# Patient Record
Sex: Male | Born: 1990 | Race: Black or African American | Hispanic: No | Marital: Single | Smoking: Never smoker
Health system: Southern US, Community
[De-identification: ages and names within clinical notes are randomized; demographics above are authoritative.]

## PROBLEM LIST (undated history)

## (undated) DIAGNOSIS — IMO0002 Reserved for concepts with insufficient information to code with codable children: Secondary | ICD-10-CM

---
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
## 2012-05-28 ENCOUNTER — Emergency Department (INDEPENDENT_AMBULATORY_CARE_PROVIDER_SITE_OTHER): Payer: Self-pay

## 2012-05-28 ENCOUNTER — Emergency Department (INDEPENDENT_AMBULATORY_CARE_PROVIDER_SITE_OTHER)
Admission: EM | Admit: 2012-05-28 | Discharge: 2012-05-28 | Disposition: A | Discharge status: Home / Self Care | Payer: Self-pay | Source: Home / Self Care | Attending: Emergency Medicine | Admitting: Emergency Medicine

## 2012-05-28 ENCOUNTER — Encounter (HOSPITAL_COMMUNITY): Payer: Self-pay

## 2012-05-28 DIAGNOSIS — IMO0002 Reserved for concepts with insufficient information to code with codable children: Secondary | ICD-10-CM

## 2012-05-28 DIAGNOSIS — S8390XA Sprain of unspecified site of unspecified knee, initial encounter: Secondary | ICD-10-CM

## 2012-05-28 DIAGNOSIS — S6000XA Contusion of unspecified finger without damage to nail, initial encounter: Secondary | ICD-10-CM

## 2012-05-28 DIAGNOSIS — S60041A Contusion of right ring finger without damage to nail, initial encounter: Secondary | ICD-10-CM

## 2012-05-28 MED ORDER — TRAMADOL HCL 50 MG PO TABS: 100.0000 mg | ORAL_TABLET | Freq: Three times a day (TID) | ORAL | Status: AC | PRN
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
## 2013-07-05 ENCOUNTER — Emergency Department (HOSPITAL_COMMUNITY): Payer: Self-pay

## 2013-07-05 ENCOUNTER — Emergency Department (INDEPENDENT_AMBULATORY_CARE_PROVIDER_SITE_OTHER): Payer: Worker's Compensation

## 2013-07-05 ENCOUNTER — Encounter (HOSPITAL_COMMUNITY): Payer: Self-pay

## 2013-07-05 ENCOUNTER — Emergency Department (INDEPENDENT_AMBULATORY_CARE_PROVIDER_SITE_OTHER)
Admission: EM | Admit: 2013-07-05 | Discharge: 2013-07-05 | Disposition: A | Discharge status: Home / Self Care | Payer: Managed Care, Other (non HMO) | Source: Home / Self Care | Attending: Family Medicine | Admitting: Family Medicine

## 2013-07-05 DIAGNOSIS — IMO0001 Reserved for inherently not codable concepts without codable children: Secondary | ICD-10-CM

## 2013-07-05 DIAGNOSIS — M20019 Mallet finger of unspecified finger(s): Secondary | ICD-10-CM

## 2013-07-05 NOTE — ED Notes (Addendum)
States his right ring finger got caught in boxes he was moving at work on Friday; used motrin for pain w/o relief; history of surgery on same finger. Finger deformity DIP, reports pain entire finger; skin intactHistory correction: patient injury on Friday of last week

## 2013-07-05 NOTE — ED Provider Notes (Signed)
  CSN: [id number]     Arrival date & time 07/05/13  1633 History     First MD Initiated Contact with Patient 07/05/13 1813     Chief Complaint  Patient presents with  . Finger Injury   (Consider location/radiation/quality/duration/timing/severity/associated sxs/prior Treatment) Patient is a 22 y.o. male presenting with hand pain. The history is provided by the patient.  Hand Pain This is a new problem. The current episode started more than 2 days ago (injured on fri, unable to work on sat, moving boxes and caught finger.). The problem has not changed since onset.   History reviewed. No pertinent past medical history. Past Surgical History  Procedure Laterality Date  . Finger surgery     History reviewed. No pertinent family history. History  Substance Use Topics  . Smoking status: Never Smoker   . Smokeless tobacco: Not on file  . Alcohol Use: No    Review of Systems  Constitutional: Negative.   Musculoskeletal: Positive for joint swelling.  Skin: Negative.     Allergies  Review of patient's allergies indicates no known allergies.  Home Medications  No current outpatient prescriptions on file. BP 124/70  Pulse 86  Temp(Src) 98.3 F (36.8 C) (Oral)  Resp 18  SpO2 99% Physical Exam  Nursing note and vitals reviewed. Constitutional: He is oriented to person, place, and time. He appears well-developed and well-nourished.  Musculoskeletal: He exhibits tenderness.       Hands: Neurological: He is alert and oriented to person, place, and time.  Skin: Skin is warm and dry.    ED Course   Procedures (including critical care time)  Labs Reviewed - No data to display Dg Finger Ring Right  07/05/2013   *RADIOLOGY REPORT*  Clinical Data: Pain/deformity post trauma.  RIGHT RING FINGER 2+V  Comparison: 05/28/2012  Findings: Examination demonstrates a persistent flexion deformity of the fourth distal interphalangeal joint unchanged.  No acute fracture or dislocation.   IMPRESSION: No acute findings.  Persistent flexion deformity of the fourth distal interphalangeal joint.   Original Report Authenticated By: Elberta Fortis, M.D.   1. 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   Linna Hoff, MD 07/05/13 [phone]

## 2013-07-12 NOTE — ED Notes (Signed)
Call from [facility] W/C manager , inquiring about diagnosis and disposition of pt , Was advised pt was instructed to f/u w W/C provider of company choice, since we cannot refer on W/C patients from this facility for non-emergent patients.Refered to medical record for further assistance

## 2013-08-02 ENCOUNTER — Emergency Department (INDEPENDENT_AMBULATORY_CARE_PROVIDER_SITE_OTHER)
Admission: EM | Admit: 2013-08-02 | Discharge: 2013-08-02 | Disposition: A | Discharge status: Home / Self Care | Payer: Managed Care, Other (non HMO) | Source: Home / Self Care | Attending: Family Medicine | Admitting: Family Medicine

## 2013-08-02 ENCOUNTER — Encounter (HOSPITAL_COMMUNITY): Payer: Self-pay | Admitting: *Deleted

## 2013-08-02 DIAGNOSIS — R1110 Vomiting, unspecified: Secondary | ICD-10-CM

## 2013-08-02 DIAGNOSIS — R197 Diarrhea, unspecified: Secondary | ICD-10-CM

## 2013-08-02 HISTORY — DX: Reserved for concepts with insufficient information to code with codable children: IMO0002

## 2013-08-02 LAB — POCT URINALYSIS DIP (DEVICE)
Leukocytes, UA: NEGATIVE
Protein, ur: NEGATIVE mg/dL
Specific Gravity, Urine: 1.02 (ref 1.005–1.030)
Urobilinogen, UA: 2 mg/dL — ABNORMAL HIGH (ref 0.0–1.0)
pH: 7 (ref 5.0–8.0)

## 2013-08-02 MED ORDER — ONDANSETRON 4 MG PO TBDP
4.0000 mg | ORAL_TABLET | Freq: Once | ORAL | Status: AC
  Administered 2013-08-02: 4 mg via ORAL

## 2013-08-02 MED ORDER — ONDANSETRON 4 MG PO TBDP: 4.0000 mg | ORAL_TABLET | Freq: Three times a day (TID) | ORAL | Status: DC | PRN

## 2013-08-02 MED ORDER — ONDANSETRON 4 MG PO TBDP
ORAL_TABLET | ORAL | Status: AC
  Filled 2013-08-02: qty 1

## 2013-08-02 NOTE — ED Notes (Signed)
C/o stomach pain, nausea and vomiting after eating [employer] chicken @ 1700.  Vomited x 2.  Nausea gone now but stomach still hurts.  Had diarrhea x 4-5 since he has been here.

## 2013-08-02 NOTE — ED Provider Notes (Signed)
Medical screening examination/treatment/procedure(s) were performed by resident physician or non-physician practitioner and as supervising physician I was immediately available for consultation/collaboration.   Selwyn Reason DOUGLAS MD.   Alyse Kathan D Terriah Reggio, MD 08/02/13 2026 

## 2013-08-02 NOTE — ED Provider Notes (Signed)
CSN: [id number]     Arrival date & time 08/02/13  1806 History   First MD Initiated Contact with Patient 08/02/13 1928     Chief Complaint  Patient presents with  . Nausea   (Consider location/radiation/quality/duration/timing/severity/associated sxs/prior Treatment) Patient is a 22 y.o. male presenting with vomiting. The history is provided by the patient. No language interpreter was used.  Emesis Severity:  Moderate Duration:  3 hours Timing:  Intermittent Number of daily episodes:  3 Progression:  Improving Chronicity:  New Relieved by:  Nothing Worsened by:  Nothing tried Ineffective treatments:  None tried Associated symptoms: abdominal pain and diarrhea    Pt complains of abdominal cramping, vomiting and diarrhea.   Pt reports began after eating at [employer].   Past Medical History  Diagnosis Date  . Tendon laceration    Past Surgical History  Procedure Laterality Date  . Finger surgery Right 2006    fx ring finger   Family History  Problem Relation Age of Onset  . Diabetes Mother    History  Substance Use Topics  . Smoking status: Never Smoker   . Smokeless tobacco: Not on file  . Alcohol Use: No    Review of Systems  Gastrointestinal: Positive for vomiting, abdominal pain and diarrhea.  All other systems reviewed and are negative.    Allergies  Review of patient's allergies indicates no known allergies.  Home Medications   Current Outpatient Rx  Name  Route  Sig  Dispense  Refill  . ibuprofen (ADVIL,MOTRIN) 600 MG tablet   Oral   Take 600 mg by mouth every 6 (six) hours as needed for pain.          BP 105/51  Pulse 59  Temp(Src) 98.6 F (37 C) (Oral)  Resp 16  SpO2 97% Physical Exam  Nursing note and vitals reviewed. Constitutional: He is oriented to person, place, and time. He appears well-developed and well-nourished.  HENT:  Head: Normocephalic.  Eyes: EOM are normal. Pupils are equal, round, and reactive to light.  Neck: Normal  range of motion.  Cardiovascular: Normal rate and regular rhythm.   Pulmonary/Chest: Effort normal and breath sounds normal.  Abdominal: Soft. Bowel sounds are normal.  Musculoskeletal: Normal range of motion.  Neurological: He is alert and oriented to person, place, and time.  Psychiatric: He has a normal mood and affect.    ED Course  Procedures (including critical care time) Labs Review Labs Reviewed  URINALYSIS, DIPSTICK ONLY   Imaging Review No results found.  MDM   1. Vomiting   2. Diarrhea    zofran  Odt.    Pt given rx for zofran.   I advised recheck in 24 hours if symptoms not improving    Elson Areas, [place] 08/02/13 2018

## 2013-08-23 ENCOUNTER — Telehealth (HOSPITAL_COMMUNITY): Payer: Self-pay | Admitting: *Deleted

## 2013-08-23 NOTE — ED Notes (Signed)
Pt. called and said he needs a copy of his work note from 8/11 or 8/12.  He said he gave it to them, but they lost it.  I told him I would call back when it was ready. Vassie Moselle 08/23/2013

## 2013-08-28 NOTE — ED Notes (Signed)
Work note

## 2013-11-08 ENCOUNTER — Emergency Department (HOSPITAL_COMMUNITY): Payer: Managed Care, Other (non HMO)

## 2013-11-08 ENCOUNTER — Encounter (HOSPITAL_COMMUNITY): Payer: Self-pay | Admitting: Emergency Medicine

## 2013-11-08 ENCOUNTER — Emergency Department (HOSPITAL_COMMUNITY)
Admission: EM | Admit: 2013-11-08 | Discharge: 2013-11-08 | Disposition: A | Discharge status: Home / Self Care | Payer: Managed Care, Other (non HMO) | Attending: Emergency Medicine | Admitting: Emergency Medicine

## 2013-11-08 DIAGNOSIS — R079 Chest pain, unspecified: Secondary | ICD-10-CM | POA: Insufficient documentation

## 2013-11-08 DIAGNOSIS — R059 Cough, unspecified: Secondary | ICD-10-CM | POA: Insufficient documentation

## 2013-11-08 DIAGNOSIS — R509 Fever, unspecified: Secondary | ICD-10-CM | POA: Insufficient documentation

## 2013-11-08 DIAGNOSIS — R05 Cough: Secondary | ICD-10-CM | POA: Insufficient documentation

## 2013-11-08 DIAGNOSIS — R110 Nausea: Secondary | ICD-10-CM | POA: Insufficient documentation

## 2013-11-08 DIAGNOSIS — B9789 Other viral agents as the cause of diseases classified elsewhere: Secondary | ICD-10-CM | POA: Insufficient documentation

## 2013-11-08 DIAGNOSIS — IMO0001 Reserved for inherently not codable concepts without codable children: Secondary | ICD-10-CM | POA: Insufficient documentation

## 2013-11-08 DIAGNOSIS — B349 Viral infection, unspecified: Secondary | ICD-10-CM

## 2013-11-08 MED ORDER — ONDANSETRON HCL 4 MG PO TABS: 4.0000 mg | ORAL_TABLET | Freq: Four times a day (QID) | ORAL | Status: DC

## 2013-11-08 NOTE — ED Notes (Signed)
Pt c/o abd pain; fever; headache; back pain

## 2013-11-08 NOTE — ED Provider Notes (Signed)
Medical screening examination/treatment/procedure(s) were performed by non-physician practitioner and as supervising physician I was immediately available for consultation/collaboration.  EKG Interpretation   None       Devoria Albe, MD, Armando Gang   Ward Givens, MD 11/08/13 [phone]

## 2013-11-08 NOTE — ED Provider Notes (Signed)
CSN: [id number]     Arrival date & time 11/08/13  0630 History   First MD Initiated Contact with Patient 11/08/13 [phone]     Chief Complaint  Patient presents with  . Abdominal Pain   (Consider location/radiation/quality/duration/timing/severity/associated sxs/prior Treatment) HPI Comments: Patient presents to the ED with a chief complaint of chest pain, cough, generalized myalgias, and subjective fever.  He also endorses nausea, but no vomiting, diarrhea, constipation, or urinary symptoms.  Patient states that the symptoms started last night.  He has not tried anything to alleviate his symptoms.  Nothing makes his symptoms better or worse.  He has no other health problems.  The history is provided by the patient. No language interpreter was used.    Past Medical History  Diagnosis Date  . Tendon laceration    Past Surgical History  Procedure Laterality Date  . Finger surgery Right 2006    fx ring finger   Family History  Problem Relation Age of Onset  . Diabetes Mother    History  Substance Use Topics  . Smoking status: Never Smoker   . Smokeless tobacco: Not on file  . Alcohol Use: No    Review of Systems  All other systems reviewed and are negative.    Allergies  Review of patient's allergies indicates no known allergies.  Home Medications   Current Outpatient Rx  Name  Route  Sig  Dispense  Refill  . ibuprofen (ADVIL,MOTRIN) 600 MG tablet   Oral   Take 600 mg by mouth every 6 (six) hours as needed for pain.         Marland Kitchen ondansetron (ZOFRAN ODT) 4 MG disintegrating tablet   Oral   Take 1 tablet (4 mg total) by mouth every 8 (eight) hours as needed for nausea.   10 tablet   0    There were no vitals taken for this visit. Physical Exam  Nursing note and vitals reviewed. Constitutional: He is oriented to person, place, and time. He appears well-developed and well-nourished.  HENT:  Head: Normocephalic and atraumatic.  Right Ear: External ear normal.  Left  Ear: External ear normal.  Nose: Nose normal.  Mouth/Throat: Oropharynx is clear and moist. No oropharyngeal exudate.  TMs are clear, oropharynx is mildly erythematous, no exudates, no signs of tonsillar or peritonsillar abscess  Eyes: Conjunctivae and EOM are normal. Pupils are equal, round, and reactive to light. Right eye exhibits no discharge. Left eye exhibits no discharge. No scleral icterus.  Neck: Normal range of motion. Neck supple. No JVD present.  Cardiovascular: Normal rate, regular rhythm, normal heart sounds and intact distal pulses.  Exam reveals no gallop and no friction rub.   No murmur heard. Pulmonary/Chest: Effort normal and breath sounds normal. No respiratory distress. He has no wheezes. He has no rales. He exhibits no tenderness.  Abdominal: Soft. He exhibits no distension and no mass. There is no tenderness. There is no rebound and no guarding.  No RLQ tenderness or pain at McBurney's point, no Murphy's sign, no left sided tenderness, no signs  Musculoskeletal: Normal range of motion. He exhibits no edema and no tenderness.  Neurological: He is alert and oriented to person, place, and time.  Skin: Skin is warm and dry.  Psychiatric: He has a normal mood and affect. His behavior is normal. Judgment and thought content normal.    ED Course  Procedures (including critical care time) Labs Review Labs Reviewed - No data to display Imaging Review No results found.  EKG Interpretation   None       MDM   1. Viral syndrome    No focal abdominal tenderness.  Patient looks very well.  VSS and afebrile.  Not in any apparent distress.  Pt CXR negative for acute infiltrate. Patients symptoms are consistent with URI, likely viral etiology. Discussed that antibiotics are not indicated for viral infections. Pt will be discharged with symptomatic treatment.  Verbalizes understanding and is agreeable with plan. Pt is hemodynamically stable & in NAD prior to  dc.     Roxy Horseman, PA-C 11/08/13 [phone]

## 2013-11-12 ENCOUNTER — Emergency Department (HOSPITAL_COMMUNITY)
Admission: EM | Admit: 2013-11-12 | Discharge: 2013-11-12 | Disposition: A | Discharge status: Home / Self Care | Payer: Managed Care, Other (non HMO) | Attending: Emergency Medicine | Admitting: Emergency Medicine

## 2013-11-12 ENCOUNTER — Encounter (HOSPITAL_COMMUNITY): Payer: Self-pay | Admitting: Emergency Medicine

## 2013-11-12 DIAGNOSIS — B9789 Other viral agents as the cause of diseases classified elsewhere: Secondary | ICD-10-CM

## 2013-11-12 DIAGNOSIS — J069 Acute upper respiratory infection, unspecified: Secondary | ICD-10-CM | POA: Insufficient documentation

## 2013-11-12 DIAGNOSIS — R1013 Epigastric pain: Secondary | ICD-10-CM | POA: Insufficient documentation

## 2013-11-12 DIAGNOSIS — R42 Dizziness and giddiness: Secondary | ICD-10-CM | POA: Insufficient documentation

## 2013-11-12 MED ORDER — PANTOPRAZOLE SODIUM 20 MG PO TBEC: 20.0000 mg | DELAYED_RELEASE_TABLET | Freq: Every day | ORAL | Status: DC

## 2013-11-12 MED ORDER — PROMETHAZINE HCL 25 MG PO TABS: 25.0000 mg | ORAL_TABLET | Freq: Four times a day (QID) | ORAL | Status: DC | PRN

## 2013-11-12 MED ORDER — GI COCKTAIL ~~LOC~~
30.0000 mL | Freq: Once | ORAL | Status: AC
  Administered 2013-11-12: 30 mL via ORAL
  Filled 2013-11-12: qty 30

## 2013-11-12 MED ORDER — METOCLOPRAMIDE HCL 5 MG/ML IJ SOLN
10.0000 mg | Freq: Once | INTRAMUSCULAR | Status: AC
  Administered 2013-11-12: 10 mg via INTRAMUSCULAR
  Filled 2013-11-12: qty 2

## 2013-11-12 NOTE — ED Provider Notes (Signed)
Medical screening examination/treatment/procedure(s) were performed by non-physician practitioner and as supervising physician I was immediately available for consultation/collaboration.    Ofilia Rayon M Darthula Desa, MD 11/12/13 2133 

## 2013-11-12 NOTE — ED Notes (Signed)
Patient is alert and oriented x3.  He was given DC instructions and follow up visit instructions.  Patient gave verbal understanding.  He was DC ambulatory under his own power to home.  V/S stable.  He was not showing any signs of distress on DC 

## 2013-11-12 NOTE — ED Provider Notes (Signed)
CSN: [id number]     Arrival date & time 11/12/13  0034 History   First MD Initiated Contact with Patient 11/12/13 0132     Chief Complaint  Patient presents with  . Headache  . Abdominal Pain   (Consider location/radiation/quality/duration/timing/severity/associated sxs/prior Treatment) HPI History provided by pt.   Pt presents w/ constant headache behind the eyes x 3 days.  Had single episode of dizziness upon standing at onset, but otherwise associated w/ subjective fever photophobia, nasal congestion, rhinorrhea, cough, stomach ache, generalized weakness.  Denies vision changes and N/V/D.  Known sick contacts.  No head trauma.  No PMH.  Past Medical History  Diagnosis Date  . Tendon laceration    Past Surgical History  Procedure Laterality Date  . Finger surgery Right 2006    fx ring finger   Family History  Problem Relation Age of Onset  . Diabetes Mother    History  Substance Use Topics  . Smoking status: Never Smoker   . Smokeless tobacco: Not on file  . Alcohol Use: No    Review of Systems  All other systems reviewed and are negative.    Allergies  Review of patient's allergies indicates no known allergies.  Home Medications   Current Outpatient Rx  Name  Route  Sig  Dispense  Refill  . acetaminophen (TYLENOL) 500 MG tablet   Oral   Take 500 mg by mouth every 6 (six) hours as needed for mild pain or headache.         . ibuprofen (ADVIL,MOTRIN) 200 MG tablet   Oral   Take 400 mg by mouth every 6 (six) hours as needed for fever or moderate pain.         Marland Kitchen ondansetron (ZOFRAN) 4 MG tablet   Oral   Take 1 tablet (4 mg total) by mouth every 6 (six) hours.   12 tablet   0    BP 127/92  Pulse 74  Temp(Src) 98 F (36.7 C) (Oral)  Resp 20  SpO2 100% Physical Exam  Nursing note and vitals reviewed. Constitutional: He is oriented to person, place, and time. He appears well-developed and well-nourished. No distress.  HENT:  Head: Normocephalic and  atraumatic.  Eyes:  Normal appearance  Neck: Normal range of motion.  No meningismus   Cardiovascular: Normal rate, regular rhythm and intact distal pulses.   Pulmonary/Chest: Effort normal and breath sounds normal.  Abdominal: Soft. Bowel sounds are normal. He exhibits no distension.  Mild epigastric ttp  Musculoskeletal: Normal range of motion.  Neurological: He is alert and oriented to person, place, and time. No sensory deficit. Coordination normal.  CN 3-12 intact.  No nystagmus. 5/5 and equal upper and lower extremity strength.  No past pointing.     Skin: Skin is warm and dry. No rash noted.  Psychiatric: He has a normal mood and affect. His behavior is normal.    ED Course  Procedures (including critical care time) Labs Review Labs Reviewed - No data to display Imaging Review No results found.  EKG Interpretation   None       MDM   1. Viral respiratory illness   2. Epigastric pain    22yo healthy M presents w/ headache x 3d.  Afebrile, non-toxic appearing and NAD, no meningeal signs or focal neuro deficits.  Suspect viral URI.  Will treat pain w/ IM reglan.  Also c/o abd pain for 3+ days w/out N/V, change in bowels or urinary sx.  Epigastric tenderness  and abd otherwise benign.  Pt to receive a GI cocktail.  Headache and abd pain resolved.  Pt d/c'd home w/ phenergan to be taken w/ benadryl to trial for headache.  Prescribed protonix for gastritis.  Dietary recommendations made.  Return precautions discussed.     Otilio Miu, PA-C 11/12/13 2005

## 2013-11-12 NOTE — ED Notes (Signed)
Pt has headache 7.10 and abdominal pain 8/10,  Denies nausea vomiting or diarrhea

## 2014-05-28 ENCOUNTER — Emergency Department (HOSPITAL_COMMUNITY)
Admission: EM | Admit: 2014-05-28 | Discharge: 2014-05-28 | Disposition: A | Discharge status: Home / Self Care | Payer: BC Managed Care – PPO | Attending: Emergency Medicine | Admitting: Emergency Medicine

## 2014-05-28 ENCOUNTER — Emergency Department (HOSPITAL_COMMUNITY): Payer: BC Managed Care – PPO

## 2014-05-28 ENCOUNTER — Encounter (HOSPITAL_COMMUNITY): Payer: Self-pay | Admitting: Emergency Medicine

## 2014-05-28 DIAGNOSIS — Z9889 Other specified postprocedural states: Secondary | ICD-10-CM | POA: Insufficient documentation

## 2014-05-28 DIAGNOSIS — Y9289 Other specified places as the place of occurrence of the external cause: Secondary | ICD-10-CM | POA: Insufficient documentation

## 2014-05-28 DIAGNOSIS — IMO0001 Reserved for inherently not codable concepts without codable children: Secondary | ICD-10-CM

## 2014-05-28 DIAGNOSIS — S6980XA Other specified injuries of unspecified wrist, hand and finger(s), initial encounter: Secondary | ICD-10-CM | POA: Insufficient documentation

## 2014-05-28 DIAGNOSIS — S6990XA Unspecified injury of unspecified wrist, hand and finger(s), initial encounter: Principal | ICD-10-CM | POA: Insufficient documentation

## 2014-05-28 DIAGNOSIS — W208XXA Other cause of strike by thrown, projected or falling object, initial encounter: Secondary | ICD-10-CM | POA: Insufficient documentation

## 2014-05-28 DIAGNOSIS — M20019 Mallet finger of unspecified finger(s): Secondary | ICD-10-CM | POA: Insufficient documentation

## 2014-05-28 DIAGNOSIS — Y9389 Activity, other specified: Secondary | ICD-10-CM | POA: Insufficient documentation

## 2014-05-28 DIAGNOSIS — M79644 Pain in right finger(s): Secondary | ICD-10-CM

## 2014-05-28 MED ORDER — IBUPROFEN 600 MG PO TABS: 600.0000 mg | ORAL_TABLET | Freq: Four times a day (QID) | ORAL | Status: DC | PRN

## 2014-05-28 NOTE — ED Notes (Signed)
Patient states he slammed his right hand, 4th digit in a car door @ 1 hour ago. Patient states his finger is swelling.

## 2014-05-28 NOTE — ED Provider Notes (Signed)
CSN: [id number]     Arrival date & time 05/28/14  2025 History  This chart was scribed for non-physician provider Antony MaduraKelly Kianni Lheureux, PA-C, working with Hurman HornJohn M Bednar, MD by Phillis HaggisGabriella Gaje, ED Scribe. This patient was seen in room WTR9/WTR9 and patient care was started at 9:37 PM.   Chief Complaint  Patient presents with  . Finger Injury    4th digit, right hand   Patient is a 23 y.o. male presenting with hand injury. The history is provided by the patient. No language interpreter was used.  Hand Injury Location:  Finger Finger location:  R ring finger  HPI Comments: Eric Mcclain is a 23 y.o. male who presents to the Emergency Department complaining of throbbing right fourth digit pain onset 2 hour ago. He states that he hit his finger in the car door. He states that his finger was swollen when he slammed his finger in the door. He reports a history of injury to the finger from a football injury in 8th grade, but states that the deformity of his finger did not happen until two years ago. He denies change in his deformity with injury today. He states that he took ibuprofen for the pain with no relief.    Past Medical History  Diagnosis Date  . Tendon laceration    Past Surgical History  Procedure Laterality Date  . Finger surgery Right 2006    fx ring finger   Family History  Problem Relation Age of Onset  . Diabetes Mother    History  Substance Use Topics  . Smoking status: Never Smoker   . Smokeless tobacco: Not on file  . Alcohol Use: No    Review of Systems  Musculoskeletal: Positive for arthralgias (right 4th digit).  All other systems reviewed and are negative.   Allergies  Review of patient's allergies indicates no known allergies.  Home Medications   Prior to Admission medications   Medication Sig Start Date End Date Taking? Authorizing Provider  ibuprofen (ADVIL,MOTRIN) 600 MG tablet Take 1 tablet (600 mg total) by mouth every 6 (six) hours as needed. 05/28/14   Antony MaduraKelly  Ahava Kissoon, PA-C   BP 117/60  Pulse 63  Temp(Src) 98.4 F (36.9 C) (Oral)  Resp 18  Ht 6\' 7"  (2.007 m)  Wt 180 lb (81.647 kg)  BMI 20.27 kg/m2  SpO2 100%  Physical Exam  Nursing note and vitals reviewed. Constitutional: He is oriented to person, place, and time. He appears well-developed and well-nourished. No distress.  HENT:  Head: Normocephalic and atraumatic.  Eyes: Conjunctivae and EOM are normal. No scleral icterus.  Neck: Normal range of motion.  Cardiovascular: Normal rate, regular rhythm and intact distal pulses.   Capillary refill normal in all digits of right hand. Distal radial pulse 2+ in RUE.  Pulmonary/Chest: Effort normal. No respiratory distress.  Musculoskeletal:       Right wrist: Normal.       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 He exhibits normal two-point discrimination, normal capillary refill and no swelling. Normal sensation noted.       Hands: Neurological: He is alert and oriented to person, place, and time. He exhibits normal muscle tone. Coordination normal.  No gross sensory deficits.  Skin: Skin is warm and dry. No rash noted. He is not diaphoretic. No erythema. No pallor.  Psychiatric:  He has a normal mood and affect. His behavior is normal.    ED Course  Procedures (including critical care time) DIAGNOSTIC STUDIES: Oxygen Saturation is 100% on room air, normal by my interpretation.    COORDINATION OF CARE: 9:40 PM-Discussed treatment plan which includes ice, ibuprofen, and f/u with hand specialist with pt at bedside and pt agreed to plan.   Labs Review Labs Reviewed - No data to display  Imaging Review Dg Hand Complete Right  05/28/2014   CLINICAL DATA:  Smashed right hand in door with injury to the distal right ring finger. Pain.  EXAM: RIGHT HAND - COMPLETE 3+ VIEW   COMPARISON:  07/05/2013  FINDINGS: Flexion deformity of the distal interphalangeal joint of the right fourth finger, unchanged since prior study. 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 No acute fractures are demonstrated. Soft tissues are unremarkable.  IMPRESSION: Fold flexion deformity of the distal interphalangeal joint of the right fourth finger. No acute bony abnormalities suggested.   Electronically Signed   By: Burman NievesWilliam  Stevens M.D.   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 Patient neurovascularly intact. No sensory deficits appreciated. 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 He denies ever seeing a hand specialist. Imaging shows no acute fracture or bony abnormality. Patient stable for discharge with instruction for RICE and ibuprofen. Hand specialist referral provided and return precautions discussed. Patient agreeable to plan with no unaddressed concerns.  I personally performed the services described in this documentation, which was scribed in my presence. The recorded information has been reviewed and is accurate.   Filed Vitals:   05/28/14 2048  BP: 117/60  Pulse: 63  Temp: 98.4 F (36.9 C)  TempSrc: Oral  Resp: 18  Height: 6\' 7"  (2.007 m)  Weight: 180 lb (81.647 kg)  SpO2: 100%      Antony MaduraKelly Arianna Delsanto, PA-C 05/28/14 2154

## 2014-05-28 NOTE — Discharge Instructions (Signed)
Mallet Finger A mallet, or jammed, finger occurs when the end of a straightened finger or thumb receives a blow (often from a ball). This causes a disruption (tearing) of the extensor tendon (cord like structure which attaches muscle to bone) that straightens the end of your finger. The last joint in your finger will droop and you cannot extend it. Sometimes this is associated with a small fracture (break in bone) of the base of the end bone (phalange) in your finger. It usually takes 4 to 5 weeks to heal. HOME CARE INSTRUCTIONS   Apply ice to the sore finger for 15-20 minutes, 03-04 times per day for 2 days. Put the ice in a plastic bag and place a towel between the bag of ice and your skin.  If you have a finger splint, wear your splint as directed.  You may remove the splint to wash your finger or as directed.  If your splint is off, do not try to bend the tip of your finger.  Put your splint back on as soon as possible. If your finger is numb or tingling, the splint is probably too tight. You can loosen it so it is comfortable.  Move the part of your injured finger that is not covered by the splint several times a day.  Take medications as directed by your caregiver. Only take over-the-counter or prescription medicines for pain, discomfort, or fever as directed by your caregiver.  IMPORTANT: follow up with your caregiver or keep or call for any appointments with specialists as directed. The failure to follow up could result in chronic pain and / or disability. SEEK MEDICAL CARE IF:   You have increased pain or swelling.  You notice coldness of your finger.  After treatment you still cannot extend your finger. SEEK IMMEDIATE MEDICAL CARE IF:  Your finger is swollen and very red, white, blue, numb, cold, or tingling. MAKE SURE YOU:   Understand these instructions.  Will watch your condition.  Will get help right away if you are not doing well or get worse. Document Released:  11/06/2000 Document Revised: 02/01/2012 Document Reviewed: 06/22/2008 [facility] Patient Information 2015 [place], [place]. This information is not intended to replace advice given to you by your health care provider. Make sure you discuss any questions you have with your health care provider. RICE: Routine Care for Injuries The routine care of many injuries includes Rest, Ice, Compression, and Elevation (RICE). HOME CARE INSTRUCTIONS  Rest is needed to allow your body to heal. Routine activities can usually be resumed when comfortable. Injured tendons and bones can take up to 6 weeks to heal. Tendons are the cord-like structures that attach muscle to bone.  Ice following an injury helps keep the swelling down and reduces pain.  Put ice in a plastic bag.  Place a towel between your skin and the bag.  Leave the ice on for 15-20 minutes, 3-4 times a day, or as directed by your health care provider. Do this while awake, for the first 24 to 48 hours. After that, continue as directed by your caregiver.  Compression helps keep swelling down. It also gives support and helps with discomfort. If an elastic bandage has been applied, it should be removed and reapplied every 3 to 4 hours. It should not be applied tightly, but firmly enough to keep swelling down. Watch fingers or toes for swelling, bluish discoloration, coldness, numbness, or excessive pain. If any of these problems occur, remove the bandage and reapply loosely. Contact  your caregiver if these problems continue.  Elevation helps reduce swelling and decreases pain. With extremities, such as the arms, hands, legs, and feet, the injured area should be placed near or above the level of the heart, if possible. SEEK IMMEDIATE MEDICAL CARE IF:  You have persistent pain and swelling.  You develop redness, numbness, or unexpected weakness.  Your symptoms are getting worse rather than improving after several days. These symptoms may indicate that  further evaluation or further X-rays are needed. Sometimes, X-rays may not show a small broken bone (fracture) until 1 week or 10 days later. Make a follow-up appointment with your caregiver. Ask when your X-ray results will be ready. Make sure you get your X-ray results. Document Released: 02/21/2001 Document Revised: 11/14/2013 Document Reviewed: 04/10/2011 [facility] Patient Information 2015 [place]ExitCare, [place]. This information is not intended to replace advice given to you by your health care provider. Make sure you discuss any questions you have with your health care provider.

## 2014-05-29 NOTE — ED Provider Notes (Signed)
Medical screening examination/treatment/procedure(s) were performed by non-physician practitioner and as supervising physician I was immediately available for consultation/collaboration.   EKG Interpretation None       Hurman HornJohn M Beaulah Romanek, MD 05/29/14 [phone]

## 2014-12-05 ENCOUNTER — Encounter (HOSPITAL_COMMUNITY): Payer: Self-pay | Admitting: Emergency Medicine

## 2014-12-05 ENCOUNTER — Emergency Department (INDEPENDENT_AMBULATORY_CARE_PROVIDER_SITE_OTHER)
Admission: EM | Admit: 2014-12-05 | Discharge: 2014-12-05 | Disposition: A | Discharge status: Home / Self Care | Payer: Self-pay | Source: Home / Self Care | Attending: Emergency Medicine | Admitting: Emergency Medicine

## 2014-12-05 DIAGNOSIS — A084 Viral intestinal infection, unspecified: Secondary | ICD-10-CM

## 2014-12-05 MED ORDER — ONDANSETRON 4 MG PO TBDP
ORAL_TABLET | ORAL | Status: AC
  Filled 2014-12-05: qty 1

## 2014-12-05 MED ORDER — ONDANSETRON 4 MG PO TBDP
4.0000 mg | ORAL_TABLET | Freq: Once | ORAL | Status: AC
  Administered 2014-12-05: 4 mg via ORAL

## 2014-12-05 MED ORDER — ONDANSETRON HCL 4 MG PO TABS: 4.0000 mg | ORAL_TABLET | Freq: Four times a day (QID) | ORAL | Status: DC

## 2014-12-05 NOTE — ED Notes (Signed)
Pt states that he had a episode of emesis this morning with diarrhea and abdominal pain

## 2014-12-05 NOTE — ED Provider Notes (Signed)
CSN: [id number]     Arrival date & time 12/05/14  1637 History   First MD Initiated Contact with Patient 12/05/14 1659     Chief Complaint  Patient presents with  . Abdominal Pain  . Emesis  . Diarrhea   (Consider location/radiation/quality/duration/timing/severity/associated sxs/prior Treatment) HPI Comments: 24 year old male developed vomiting last p.m. He last vomited early this morning around 5:30 AM. Later diarrhea followed. He states he has had diarrhea, watery stools without blood proximally one time every hour since this morning. He is not taking any medications. He denies fever, sore throat chest pain or back pain he does complain of occasional intermittent mild periumbilical pain.   Past Medical History  Diagnosis Date  . Tendon laceration    Past Surgical History  Procedure Laterality Date  . Finger surgery Right 2006    fx ring finger   Family History  Problem Relation Age of Onset  . Diabetes Mother    History  Substance Use Topics  . Smoking status: Never Smoker   . Smokeless tobacco: Not on file  . Alcohol Use: No    Review of Systems  Constitutional: Positive for activity change. Negative for fever and fatigue.  HENT: Negative.   Respiratory: Negative.   Cardiovascular: Negative for chest pain and leg swelling.  Gastrointestinal: Positive for nausea, vomiting, abdominal pain and diarrhea. Negative for constipation and blood in stool.  Genitourinary: Negative.   Skin: Negative.   Neurological: Negative.     Allergies  Review of patient's allergies indicates no known allergies.  Home Medications   Prior to Admission medications   Medication Sig Start Date End Date Taking? Authorizing Provider  ondansetron (ZOFRAN) 4 MG tablet Take 1 tablet (4 mg total) by mouth every 6 (six) hours. 12/05/14   Hayden Rasmussenavid Chalon Zobrist, NP   BP 115/74 mmHg  Pulse 77  Temp(Src) 98.2 F (36.8 C) (Oral)  Resp 16  SpO2 98% Physical Exam  Constitutional: He is oriented to person,  place, and time. He appears well-developed and well-nourished. No distress.  Eyes: Conjunctivae and EOM are normal.  Neck: Normal range of motion. Neck supple.  Cardiovascular: Normal rate, regular rhythm, normal heart sounds and intact distal pulses.   Pulmonary/Chest: Effort normal and breath sounds normal. No respiratory distress. He has no wheezes. He has no rales.  Abdominal: Soft. Bowel sounds are normal. He exhibits no distension and no mass. There is no tenderness. There is no rebound and no guarding.  Musculoskeletal: He exhibits no edema or tenderness.  Lymphadenopathy:    He has no cervical adenopathy.  Neurological: He is alert and oriented to person, place, and time. He exhibits normal muscle tone.  Skin: Skin is warm and dry.  Psychiatric: He has a normal mood and affect. His behavior is normal.  Nursing note and vitals reviewed.   ED Course  Procedures (including critical care time) Labs Review Labs Reviewed - No data to display  Imaging Review No results found.   MDM   1. Viral gastroenteritis   Discharged in stable condition. Sitting on table using phone and conversing with sig other. No distress. Gatorade or Pedialyte Minimal use of Immodium AD for diarrhea, only to slow it down. Do not stop your diarrhea with the medicine.  zofran 4 mg po Rx for Zofran 4 mg      Hayden Rasmussenavid Paityn Balsam, NP 12/05/14 1718  Hayden Rasmussenavid Zarai Orsborn, NP 12/05/14 1726

## 2014-12-05 NOTE — Discharge Instructions (Signed)
Viral Gastroenteritis Gatorade or Pedialyte Minimal use of Immodium AD for diarrhea, only to slow it down. Do not stop your diarrhea with the medicine.  Viral gastroenteritis is also called stomach flu. This illness is caused by a certain type of germ (virus). It can cause sudden watery poop (diarrhea) and throwing up (vomiting). This can cause you to lose body fluids (dehydration). This illness usually lasts for 3 to 8 days. It usually goes away on its own. HOME CARE   Drink enough fluids to keep your pee (urine) clear or pale yellow. Drink small amounts of fluids often.  Ask your doctor how to replace body fluid losses (rehydration).  Avoid:  Foods high in sugar.  Alcohol.  Bubbly (carbonated) drinks.  Tobacco.  Juice.  Caffeine drinks.  Very hot or cold fluids.  Fatty, greasy foods.  Eating too much at one time.  Dairy products until 24 to 48 hours after your watery poop stops.  You may eat foods with active cultures (probiotics). They can be found in some yogurts and supplements.  Wash your hands well to avoid spreading the illness.  Only take medicines as told by your doctor. Do not give aspirin to children. Do not take medicines for watery poop (antidiarrheals).  Ask your doctor if you should keep taking your regular medicines.  Keep all doctor visits as told. GET HELP RIGHT AWAY IF:   You cannot keep fluids down.  You do not pee at least once every 6 to 8 hours.  You are short of breath.  You see blood in your poop or throw up. This may look like coffee grounds.  You have belly (abdominal) pain that gets worse or is just in one small spot (localized).  You keep throwing up or having watery poop.  You have a fever.  The patient is a child younger than 3 months, and he or she has a fever.  The patient is a child older than 3 months, and he or she has a fever and problems that do not go away.  The patient is a child older than 3 months, and he or she  has a fever and problems that suddenly get worse.  The patient is a baby, and he or she has no tears when crying. MAKE SURE YOU:   Understand these instructions.  Will watch your condition.  Will get help right away if you are not doing well or get worse. Document Released: 04/27/2008 Document Revised: 02/01/2012 Document Reviewed: 08/26/2011 [facility] Patient Information 2015 [place], [place]LL[place]. This information is not intended to replace advice given to you by your health care provider. Make sure you discuss any questions you have with your health care provider.

## 2015-01-14 ENCOUNTER — Emergency Department (HOSPITAL_COMMUNITY)
Admission: EM | Admit: 2015-01-14 | Discharge: 2015-01-14 | Disposition: A | Discharge status: Home / Self Care | Payer: Self-pay | Attending: Emergency Medicine | Admitting: Emergency Medicine

## 2015-01-14 ENCOUNTER — Encounter (HOSPITAL_COMMUNITY): Payer: Self-pay

## 2015-01-14 DIAGNOSIS — K529 Noninfective gastroenteritis and colitis, unspecified: Secondary | ICD-10-CM | POA: Insufficient documentation

## 2015-01-14 MED ORDER — PROMETHAZINE HCL 25 MG PO TABS: 25.0000 mg | ORAL_TABLET | Freq: Four times a day (QID) | ORAL | Status: DC | PRN

## 2015-01-14 NOTE — ED Notes (Signed)
Pt presents with c/o abdominal pain that started around 4 yesterday morning. Pt reports he vomited twice at work and does feel better at this time but he reported to his work that he wanted to leave early and that he would get a work note.

## 2015-01-14 NOTE — ED Notes (Signed)
No N/V during ED stay DC instructions reviewed with patient

## 2015-01-14 NOTE — ED Notes (Signed)

## 2015-01-14 NOTE — Discharge Instructions (Signed)

## 2015-01-14 NOTE — ED Provider Notes (Signed)
CSN: [id number]     Arrival date & time 01/14/15  0440 History   First MD Initiated Contact with Patient 01/14/15 0445     Chief Complaint  Patient presents with  . Abdominal Pain  . Emesis     (Consider location/radiation/quality/duration/timing/severity/associated sxs/prior Treatment) HPI Comments: Pt comes in with cc of abd pain, and need for work note. Has no medical problems. Around 4 pm, pt started having some abd discomfort, cramping type, followed by emesis and diarrhea. He has had several episodes of loose BM. The abd pain improved post emesis. Work sent him to the ER for a return to work note. Pt has no fevers.  Patient is a 24 y.o. male presenting with abdominal pain and vomiting. The history is provided by the patient.  Abdominal Pain Associated symptoms: diarrhea, nausea and vomiting   Associated symptoms: no chest pain and no dysuria   Emesis Associated symptoms: abdominal pain and diarrhea     Past Medical History  Diagnosis Date  . Tendon laceration    Past Surgical History  Procedure Laterality Date  . Finger surgery Right 2006    fx ring finger   Family History  Problem Relation Age of Onset  . Diabetes Mother    History  Substance Use Topics  . Smoking status: Never Smoker   . Smokeless tobacco: Not on file  . Alcohol Use: No    Review of Systems  Constitutional: Positive for appetite change.  Cardiovascular: Negative for chest pain.  Gastrointestinal: Positive for nausea, vomiting, abdominal pain and diarrhea.  Genitourinary: Negative for dysuria.  Neurological: Negative for dizziness.      Allergies  Review of patient's allergies indicates no known allergies.  Home Medications   Prior to Admission medications   Medication Sig Start Date End Date Taking? Authorizing Provider  ondansetron (ZOFRAN) 4 MG tablet Take 1 tablet (4 mg total) by mouth every 6 (six) hours. Patient not taking: Reported on 01/14/2015 12/05/14   Hayden Rasmussen, NP   promethazine (PHENERGAN) 25 MG tablet Take 1 tablet (25 mg total) by mouth every 6 (six) hours as needed for nausea. 01/14/15   Alonzo Loving, MD   BP 130/80 mmHg  Pulse 68  Temp(Src) 97.6 F (36.4 C) (Oral)  Resp 20  SpO2 100% Physical Exam  Constitutional: He is oriented to person, place, and time. He appears well-developed.  HENT:  Head: Normocephalic and atraumatic.  Eyes: Conjunctivae and EOM are normal. Pupils are equal, round, and reactive to light.  Neck: Normal range of motion. Neck supple.  Cardiovascular: Normal rate.   Pulmonary/Chest: Effort normal.  Abdominal: Soft. Bowel sounds are normal. He exhibits no distension. There is no tenderness. There is no rebound and no guarding.  Neurological: He is alert and oriented to person, place, and time.  Skin: Skin is warm.    ED Course  Procedures (including critical care time) Labs Review Labs Reviewed - No data to display  Imaging Review No results found.   EKG Interpretation None      MDM   Final diagnoses:  Gastroenteritis    Pt comes in with cc of abd pain, emesis, diarrhea. Seems that primarily he is here for a work note. Pt needs a note to get back to work. He currently is pain free. Emesis improved, diarrhea has persisted. Pt endorses that he is only here for the note. No camping, no fevers, no bloody stools. Discussed importance of good hygiene and he is cleared to go back to  work.  Derwood KaplanAnkit Shilpa Bushee, MD 01/14/15 [phone]

## 2015-03-26 ENCOUNTER — Encounter (HOSPITAL_COMMUNITY): Payer: Self-pay | Admitting: Emergency Medicine

## 2015-03-26 ENCOUNTER — Emergency Department (HOSPITAL_COMMUNITY)
Admission: EM | Admit: 2015-03-26 | Discharge: 2015-03-26 | Disposition: A | Discharge status: Home / Self Care | Payer: Self-pay | Attending: Emergency Medicine | Admitting: Emergency Medicine

## 2015-03-26 DIAGNOSIS — H00014 Hordeolum externum left upper eyelid: Secondary | ICD-10-CM | POA: Insufficient documentation

## 2015-03-26 DIAGNOSIS — Z87828 Personal history of other (healed) physical injury and trauma: Secondary | ICD-10-CM | POA: Insufficient documentation

## 2015-03-26 DIAGNOSIS — H00016 Hordeolum externum left eye, unspecified eyelid: Secondary | ICD-10-CM

## 2015-03-26 MED ORDER — ERYTHROMYCIN 5 MG/GM OP OINT
TOPICAL_OINTMENT | Freq: Once | OPHTHALMIC | Status: AC
  Administered 2015-03-26: 19:00:00 via OPHTHALMIC
  Filled 2015-03-26: qty 3.5

## 2015-03-26 NOTE — ED Provider Notes (Addendum)
CSN: [id number]     Arrival date & time 03/26/15  1811 History   First MD Initiated Contact with Patient 03/26/15 1858     Chief Complaint  Patient presents with  . Eye Pain     (Consider location/radiation/quality/duration/timing/severity/associated sxs/prior Treatment) HPI Eric Mcclain is a 24 y.o. male with no medical problems presents to emergency department with swelling of the left eyelid. Patient states it started several days ago, worsened today. She states she has pain with blinking. Denies any visual changes, no eye redness, no pain with extraocular movements. States woke up this morning with some crusty drainage. He denies any injuries to the eye. He is not wearing contacts. No history of the same. He has been applying warm compresses with no relief.  Past Medical History  Diagnosis Date  . Tendon laceration    Past Surgical History  Procedure Laterality Date  . Finger surgery Right 2006    fx ring finger   Family History  Problem Relation Age of Onset  . Diabetes Mother    History  Substance Use Topics  . Smoking status: Never Smoker   . Smokeless tobacco: Not on file  . Alcohol Use: No    Review of Systems  Constitutional: Negative for fever and chills.  Eyes: Positive for pain and discharge. Negative for photophobia, redness and visual disturbance.  Neurological: Negative for facial asymmetry and headaches.      Allergies  Review of patient's allergies indicates no known allergies.  Home Medications   Prior to Admission medications   Medication Sig Start Date End Date Taking? Authorizing Provider  ibuprofen (ADVIL,MOTRIN) 200 MG tablet Take 200-400 mg by mouth every 6 (six) hours as needed for headache, mild pain or moderate pain.   Yes Historical Provider, MD  ondansetron (ZOFRAN) 4 MG tablet Take 1 tablet (4 mg total) by mouth every 6 (six) hours. Patient not taking: Reported on 01/14/2015 12/05/14   Hayden Rasmussen, NP  promethazine (PHENERGAN) 25 MG  tablet Take 1 tablet (25 mg total) by mouth every 6 (six) hours as needed for nausea. Patient not taking: Reported on 03/26/2015 01/14/15   Derwood Kaplan, MD   BP 121/76 mmHg  Pulse 75  Temp(Src) 98.3 F (36.8 C) (Oral)  Resp 16  SpO2 100% Physical Exam  Constitutional: He appears well-developed and well-nourished. No distress.  HENT:  Head: Normocephalic.  Left Ear: External ear normal.  Nose: Nose normal.  Mouth/Throat: Oropharynx is clear and moist.  Eyes: Conjunctivae and EOM are normal. Pupils are equal, round, and reactive to light.  Mild swelling to the medial left upper lid with a pin point whitehead  Neck: Neck supple.  Neurological: He is alert.  Skin: Skin is warm and dry.  Nursing note and vitals reviewed.   ED Course  Procedures (including critical care time) Labs Review Labs Reviewed - No data to display  Imaging Review No results found.   EKG Interpretation None      MDM   Final diagnoses:  Hordeolum, left   Patient with a stye to the left eye. Advised to do warm compresses, erythromycin ointment provided. Follow-up with primary care doctor. At this time no evidence of periorbital or orbital cellulitis. He is nontoxic appearing. Normal vital signs.  Filed Vitals:   03/26/15 1831  BP: 121/76  Pulse: 75  Temp: 98.3 F (36.8 C)  TempSrc: Oral  Resp: 16  SpO2: 100%     Jaynie Crumble, PA-C 03/26/15 1921  Linwood Dibbles, MD  03/26/15 2356  Jaynie Crumbleatyana Tierany Appleby, PA-C 04/06/15 [id number]  Linwood DibblesJon Knapp, MD 04/06/15 1052

## 2015-03-26 NOTE — ED Notes (Signed)
Pt c/o left eye pain onset yesterday, worsens with motor movement of left eye. Pt denies increase in left eye pain when RN shines light in right eye.

## 2015-03-26 NOTE — Discharge Instructions (Signed)
Continue war compresses. Erythromycin ointment every 4 hrs. Follow up with your doctor as needed.    Sty A sty (hordeolum) is an infection of a gland in the eyelid located at the base of the eyelash. A sty may develop a white or yellow head of pus. It can be puffy (swollen). Usually, the sty will burst and pus will come out on its own. They do not leave lumps in the eyelid once they drain. A sty is often confused with another form of cyst of the eyelid called a chalazion. Chalazions occur within the eyelid and not on the edge where the bases of the eyelashes are. They often are red, sore and then form firm lumps in the eyelid. CAUSES   Germs (bacteria).  Lasting (chronic) eyelid inflammation. SYMPTOMS   Tenderness, redness and swelling along the edge of the eyelid at the base of the eyelashes.  Sometimes, there is a white or yellow head of pus. It may or may not drain. DIAGNOSIS  An ophthalmologist will be able to distinguish between a sty and a chalazion and treat the condition appropriately.  TREATMENT   Styes are typically treated with warm packs (compresses) until drainage occurs.  In rare cases, medicines that kill germs (antibiotics) may be prescribed. These antibiotics may be in the form of drops, cream or pills.  If a hard lump has formed, it is generally necessary to do a small incision and remove the hardened contents of the cyst in a minor surgical procedure done in the office.  In suspicious cases, your caregiver may send the contents of the cyst to the lab to be certain that it is not a rare, but dangerous form of cancer of the glands of the eyelid. HOME CARE INSTRUCTIONS   Wash your hands often and dry them with a clean towel. Avoid touching your eyelid. This may spread the infection to other parts of the eye.  Apply heat to your eyelid for 10 to 20 minutes, several times a day, to ease pain and help to heal it faster.  Do not squeeze the sty. Allow it to drain on its  own. Wash your eyelid carefully 3 to 4 times per day to remove any pus. SEEK IMMEDIATE MEDICAL CARE IF:   Your eye becomes painful or puffy (swollen).  Your vision changes.  Your sty does not drain by itself within 3 days.  Your sty comes back within a short period of time, even with treatment.  You have redness (inflammation) around the eye.  You have a fever. Document Released: 08/19/2005 Document Revised: 02/01/2012 Document Reviewed: 02/23/2014 [facility] Patient Information 2015 [place], [place]. This information is not intended to replace advice given to you by your health care provider. Make sure you discuss any questions you have with your health care provider.

## 2015-06-04 ENCOUNTER — Emergency Department (INDEPENDENT_AMBULATORY_CARE_PROVIDER_SITE_OTHER)
Admission: EM | Admit: 2015-06-04 | Discharge: 2015-06-04 | Disposition: A | Discharge status: Home / Self Care | Payer: BLUE CROSS/BLUE SHIELD | Source: Home / Self Care | Attending: Emergency Medicine | Admitting: Emergency Medicine

## 2015-06-04 ENCOUNTER — Encounter (HOSPITAL_COMMUNITY): Payer: Self-pay | Admitting: Emergency Medicine

## 2015-06-04 DIAGNOSIS — J0100 Acute maxillary sinusitis, unspecified: Secondary | ICD-10-CM

## 2015-06-04 LAB — POCT RAPID STREP A: Streptococcus, Group A Screen (Direct): NEGATIVE

## 2015-06-04 MED ORDER — PREDNISONE 50 MG PO TABS: ORAL_TABLET | ORAL | Status: DC

## 2015-06-04 MED ORDER — AMOXICILLIN-POT CLAVULANATE 875-125 MG PO TABS: 1.0000 | ORAL_TABLET | Freq: Two times a day (BID) | ORAL | Status: DC

## 2015-06-04 NOTE — ED Provider Notes (Signed)
CSN: [id number]     Arrival date & time 06/04/15  1520 History   First MD Initiated Contact with Patient 06/04/15 1607     Chief Complaint  Patient presents with  . Facial Pain  . Dizziness   (Consider location/radiation/quality/duration/timing/severity/associated sxs/prior Treatment) HPI He is a 24 year old man here for evaluation of sinus pressure area and he states his symptoms started 2 days ago with a sore throat. Over the last 2 days he has developed maxillary sinus pressure associated with swelling across the nasal bridge. He also reports headache and a dizzy/off-balance sensation. No ear pain. He does report nasal congestion. He reports a mild cough. No shortness of breath or wheezing. No nausea or vomiting. He does report a subjective fever at home. He has not tried any medications.  Past Medical History  Diagnosis Date  . Tendon laceration    Past Surgical History  Procedure Laterality Date  . Finger surgery Right 2006    fx ring finger   Family History  Problem Relation Age of Onset  . Diabetes Mother    History  Substance Use Topics  . Smoking status: Never Smoker   . Smokeless tobacco: Not on file  . Alcohol Use: No    Review of Systems As in history of present illness Allergies  Review of patient's allergies indicates no known allergies.  Home Medications   Prior to Admission medications   Medication Sig Start Date End Date Taking? Authorizing Provider  amoxicillin-clavulanate (AUGMENTIN) 875-125 MG per tablet Take 1 tablet by mouth 2 (two) times daily. 06/04/15   Charm RingsErin J Honig, MD  ibuprofen (ADVIL,MOTRIN) 200 MG tablet Take 200-400 mg by mouth every 6 (six) hours as needed for headache, mild pain or moderate pain.    Historical Provider, MD  predniSONE (DELTASONE) 50 MG tablet Take 1 pill daily for 5 days. 06/04/15   Charm RingsErin J Honig, MD   BP 111/70 mmHg  Pulse 85  Temp(Src) 99.5 F (37.5 C) (Oral)  Resp 12  SpO2 96% Physical Exam  Constitutional: He is  oriented to person, place, and time. He appears well-developed and well-nourished.  HENT:  Mouth/Throat: No oropharyngeal exudate.  Posterior oropharynx with mild erythema. Bilateral maxillary sinus tenderness. He does have mild swelling across the nasal bridge. There is purulent discharge in the left nare with swollen and erythematous turbinates.  Eyes: Conjunctivae are normal.  Neck: Neck supple.  Cardiovascular: Normal rate, regular rhythm and normal heart sounds.   No murmur heard. Pulmonary/Chest: Effort normal and breath sounds normal. No respiratory distress. He has no wheezes. He has no rales.  Lymphadenopathy:    He has no cervical adenopathy.  Neurological: He is alert and oriented to person, place, and time.    ED Course  Procedures (including critical care time) Labs Review Labs Reviewed  POCT RAPID STREP A    Imaging Review No results found.   MDM   1. Acute maxillary sinusitis, recurrence not specified    Timing would indicate viral, but exam is concerning for bacterial infection. Treat with Augmentin and prednisone. Follow up as needed.  Charm RingsErin J Honig, MD 06/04/15 1650

## 2015-06-04 NOTE — Discharge Instructions (Signed)
You have a sinus infection. Take Augmentin twice a day for 10 days. Take a probiotic with this to prevent diarrhea. Take prednisone 1 pill daily for 5 days. This will help with the swelling. Follow-up as needed.

## 2015-06-04 NOTE — ED Notes (Signed)
C/o facial pain and dizziness for two days  States he has been hot

## 2015-06-07 LAB — CULTURE, GROUP A STREP: Strep A Culture: NEGATIVE

## 2015-06-07 NOTE — ED Notes (Signed)
Final report of strep screening negative. No further action required

## 2015-11-12 ENCOUNTER — Emergency Department (INDEPENDENT_AMBULATORY_CARE_PROVIDER_SITE_OTHER): Payer: BLUE CROSS/BLUE SHIELD

## 2015-11-12 ENCOUNTER — Encounter (HOSPITAL_COMMUNITY): Payer: Self-pay | Admitting: Emergency Medicine

## 2015-11-12 ENCOUNTER — Emergency Department (INDEPENDENT_AMBULATORY_CARE_PROVIDER_SITE_OTHER)
Admission: EM | Admit: 2015-11-12 | Discharge: 2015-11-12 | Disposition: A | Discharge status: Home / Self Care | Payer: Self-pay | Source: Home / Self Care

## 2015-11-12 DIAGNOSIS — S63502A Unspecified sprain of left wrist, initial encounter: Secondary | ICD-10-CM

## 2015-11-12 NOTE — Discharge Instructions (Signed)
Generic Wrist Exercises RANGE OF MOTION (ROM) AND STRETCHING EXERCISES - Wrist Sprain  These exercises may help you when beginning to rehabilitate your injury. Your symptoms may resolve with or without further involvement from your physician, physical therapist, or athletic trainer. While completing these exercises, remember:   Restoring tissue flexibility helps normal motion to return to the joints. This allows healthier, less painful movement and activity.  An effective stretch should be held for at least 30 seconds.  A stretch should never be painful. You should only feel a gentle lengthening or release in the stretched tissue. RANGE OF MOTION - Wrist Flexion, Active-Assisted  Extend your right / left elbow with your palm pointing down.*  Gently pull the back of your hand toward you until you feel a gentle stretch on the top of your forearm.  Hold this position for __________ seconds. Repeat __________ times. Complete this exercise __________ times per day.  *If directed by your physician, physical therapist, or athletic trainer, complete this stretch with your elbow bent rather than extended. RANGE OF MOTION - Wrist Extension, Active-Assisted   Extend your right / left elbow and turn your palm upward.*  Gently pull your palm/fingertips back so your wrist extends and your fingers point more toward the ground.  You should feel a gentle stretch on the inside of your forearm.  Hold this position for __________ seconds. Repeat __________ times. Complete this exercise __________ times per day. *If directed by your physician, physical therapist, or athletic trainer, complete this stretch with your elbow bent, rather than extended. RANGE OF MOTION - Supination, Active   Stand or sit with your elbows at your side. Bend your right / left elbow to 90 degrees.  Turn your palm upward until you feel a gentle stretch on the inside of your forearm.  Hold this position for __________ seconds.  Slowly release and return to the starting position. Repeat __________ times. Complete this stretch __________ times per day.  RANGE OF MOTION - Pronation, Active   Stand or sit with your elbows at your side. Bend your right / left elbow to 90 degrees.  Turn your palm downward until you feel a gentle stretch on the top of your forearm.  Hold this position for __________ seconds. Slowly release and return to the starting position. Repeat __________ times. Complete this stretch __________ times per day.  STRENGTHENING EXERCISES  These exercises may help you when beginning to rehabilitate your injury. They may resolve your symptoms with or without further involvement from your physician, physical therapist, or athletic trainer. While completing these exercises, remember:   Muscles can gain both the endurance and the strength needed for everyday activities through controlled exercises.  Complete these exercises as instructed by your physician, physical therapist, or athletic trainer. Progress the resistance and repetitions only as guided.  You may experience muscle soreness or fatigue, but the pain or discomfort you are trying to eliminate should never worsen during these exercises. If this pain does worsen, stop and make certain you are following the directions exactly. If the pain is still present after adjustments, discontinue the exercise until you can discuss the trouble with your clinician. STRENGTH - Wrist Flexors  Sit with your right / left forearm palm-up and fully supported. Your elbow should be resting below the height of your shoulder. Allow your wrist to extend over the edge of the surface.  Loosely holding a __________ weight or a piece of rubber exercise band/tubing, slowly curl your hand up toward your  forearm.  Hold this position for __________ seconds. Slowly lower the wrist back to the starting position in a controlled manner. Repeat __________ times. Complete this exercise  __________ times per day.  STRENGTH - Wrist Extensors  Sit with your right / left forearm palm-down and fully supported. Your elbow should be resting below the height of your shoulder. Allow your wrist to extend over the edge of the surface.  Loosely holding a __________ weight or a piece of rubber exercise band/tubing, slowly curl your hand up toward your forearm.  Hold this position for __________ seconds. Slowly lower the wrist back to the starting position in a controlled manner. Repeat __________ times. Complete this exercise __________ times per day.  STRENGTH - Forearm Supinators  Sit with your right / left forearm supported on a table, keeping your elbow below shoulder height. Rest your hand over the edge, palm down.  Gently grip a hammer or a soup ladle.  Without moving your elbow, slowly turn your palm and hand upward to a "thumbs-up" position.  Hold this position for __________ seconds. Slowly return to the starting position. Repeat __________ times. Complete this exercise __________ times per day.  STRENGTH - Forearm Pronators   Sit with your right / left forearm supported on a table, keeping your elbow below shoulder height. Rest your hand over the edge, palm up.  Gently grip a hammer or a soup ladle.  Without moving your elbow, slowly turn your palm and hand upward to a "thumbs-up" position.  Hold this position for __________ seconds. Slowly return to the starting position. Repeat __________ times. Complete this exercise __________ times per day.  STRENGTH - Grip  Grasp a tennis ball, a dense sponge, or a large, rolled sock in your hand.  Squeeze as hard as you can without increasing any pain.  Hold this position for __________ seconds. Release your grip slowly. Repeat __________ times. Complete this exercise __________ times per day.    This information is not intended to replace advice given to you by your health care provider. Make sure you discuss any questions  you have with your health care provider.   Document Released: 09/23/2005 Document Revised: 11/30/2014 Document Reviewed: 02/21/2009 Elsevier Interactive Patient Education 2016 [employer]ssssssssssssssssssssssssssssssssssssssssssssssssssssssssssssssssssssssssssssssssssssssssss

## 2015-11-12 NOTE — ED Notes (Signed)
Pt reports he inj his left wrist on 12/16 while playing basketball; fell onto ground States it hurts to pick up objects A&O x4... No acute distress.

## 2015-11-12 NOTE — ED Provider Notes (Signed)
CSN: [id number]     Arrival date & time 11/12/15  1544 History   None    Chief Complaint  Patient presents with  . Wrist Injury   (Consider location/radiation/quality/duration/timing/severity/associated sxs/prior Treatment) HPI History obtained from patient:   LOCATION:left wrist SEVERITY: 4 DURATION: Last Friday CONTEXT: Fall on outstretched hand while playing basketball QUALITY: Constant headache MODIFYING FACTORS: Ibuprofen and occasional ice pack ASSOCIATED SYMPTOMS: Pain radiating into hand, unable to lift TIMING: Constant OCCUPATION:  Past Medical History  Diagnosis Date  . Tendon laceration    Past Surgical History  Procedure Laterality Date  . Finger surgery Right 2006    fx ring finger   Family History  Problem Relation Age of Onset  . Diabetes Mother    Social History  Substance Use Topics  . Smoking status: Never Smoker   . Smokeless tobacco: None  . Alcohol Use: No    Review of Systems ROS +'ve left wrist pain  Denies: HEADACHE, NAUSEA, ABDOMINAL PAIN, CHEST PAIN, CONGESTION, DYSURIA, SHORTNESS OF BREATH  Allergies  Review of patient's allergies indicates no known allergies.  Home Medications   Prior to Admission medications   Medication Sig Start Date End Date Taking? Authorizing Provider  amoxicillin-clavulanate (AUGMENTIN) 875-125 MG per tablet Take 1 tablet by mouth 2 (two) times daily. 06/04/15   Charm RingsErin J Honig, MD  ibuprofen (ADVIL,MOTRIN) 200 MG tablet Take 200-400 mg by mouth every 6 (six) hours as needed for headache, mild pain or moderate pain.    Historical Provider, MD  predniSONE (DELTASONE) 50 MG tablet Take 1 pill daily for 5 days. 06/04/15   Charm RingsErin J Honig, MD   Meds Ordered and Administered this Visit  Medications - No data to display  BP 129/71 mmHg  Pulse 68  Temp(Src) 98.2 F (36.8 C) (Oral)  Resp 14  SpO2 100% No data found.   Physical Exam  Constitutional: He appears well-developed and well-nourished.   Musculoskeletal: He exhibits tenderness.       Left wrist: He exhibits decreased range of motion, tenderness and bony tenderness. He exhibits no deformity.  Nursing note and vitals reviewed.   ED Course  Procedures (including critical care time)  Labs Review Labs Reviewed - No data to display  Imaging Review Dg Wrist Complete Left  11/12/2015  CLINICAL DATA:  Larey SeatFell playing basketball five days ago. Persistent left wrist pain. EXAM: LEFT WRIST - COMPLETE 3+ VIEW COMPARISON:  None. FINDINGS: The joint spaces are maintained.  No acute fracture is identified. IMPRESSION: No acute bony findings. Electronically Signed   By: Rudie MeyerP.  Gallerani M.D.   On: 11/12/2015 17:20     Visual Acuity Review  Right Eye Distance:   Left Eye Distance:   Bilateral Distance:    Right Eye Near:   Left Eye Near:    Bilateral Near:         MDM   1. Wrist sprain, left, initial encounter    Wrist  brace applied, work note provided.  Patient is advised to continue home symptomatic treatment.  Patient is advised that if there are new or worsening symptoms or attend the emergency department, or contact primary care provider. Instructions of care provided discharged home in stable condition.  THIS NOTE WAS GENERATED USING A VOICE RECOGNITION SOFTWARE PROGRAM. ALL REASONABLE EFFORTS  WERE MADE TO PROOFREAD THIS DOCUMENT FOR ACCURACY.    Tharon AquasFrank C Patrick, PA 11/12/15 [phone]

## 2015-11-13 ENCOUNTER — Emergency Department (HOSPITAL_COMMUNITY)
Admission: EM | Admit: 2015-11-13 | Discharge: 2015-11-14 | Disposition: A | Discharge status: Home / Self Care | Payer: BLUE CROSS/BLUE SHIELD | Attending: Emergency Medicine | Admitting: Emergency Medicine

## 2015-11-13 ENCOUNTER — Encounter (HOSPITAL_COMMUNITY): Payer: Self-pay

## 2015-11-13 DIAGNOSIS — W1839XA Other fall on same level, initial encounter: Secondary | ICD-10-CM | POA: Insufficient documentation

## 2015-11-13 DIAGNOSIS — S6992XA Unspecified injury of left wrist, hand and finger(s), initial encounter: Secondary | ICD-10-CM | POA: Insufficient documentation

## 2015-11-13 DIAGNOSIS — Y9289 Other specified places as the place of occurrence of the external cause: Secondary | ICD-10-CM | POA: Insufficient documentation

## 2015-11-13 DIAGNOSIS — Y998 Other external cause status: Secondary | ICD-10-CM | POA: Insufficient documentation

## 2015-11-13 DIAGNOSIS — Y9367 Activity, basketball: Secondary | ICD-10-CM | POA: Insufficient documentation

## 2015-11-13 DIAGNOSIS — Z79899 Other long term (current) drug therapy: Secondary | ICD-10-CM | POA: Insufficient documentation

## 2015-11-13 NOTE — ED Notes (Signed)
Pt was seen at urgent Care yesterday and his wrist was xray, no fracture, pt states that it still really hurts and he's unable to pick things up and use his wrist

## 2015-11-14 MED ORDER — HYDROCODONE-ACETAMINOPHEN 5-325 MG PO TABS: 1.0000 | ORAL_TABLET | Freq: Four times a day (QID) | ORAL | Status: DC | PRN

## 2015-11-14 MED ORDER — IBUPROFEN 800 MG PO TABS: 800.0000 mg | ORAL_TABLET | Freq: Three times a day (TID) | ORAL | Status: DC | PRN

## 2015-11-14 NOTE — ED Provider Notes (Signed)
CSN: [id number]     Arrival date & time 11/13/15  2312 History   First MD Initiated Contact with Patient 11/14/15 0051     Chief Complaint  Patient presents with  . Wrist Injury     (Consider location/radiation/quality/duration/timing/severity/associated sxs/prior Treatment) HPI Patient presents to the emergency department with left wrist pain that started on Saturday.  The patient was playing basketball when he fell, landing on his right wrist.  Patient states he was seen at urgent care yesterday with negative x-rays.  He states he was placed in a Velcro splint.  The patient states that he was told to take ibuprofen but has increasing pain with activity.  The patient states that nothing seems make her condition better.  Movement and palpation make the pain worse.  Patient denies numbness, weakness, or Past Medical History  Diagnosis Date  . Tendon laceration    Past Surgical History  Procedure Laterality Date  . Finger surgery Right 2006    fx ring finger   Family History  Problem Relation Age of Onset  . Diabetes Mother    Social History  Substance Use Topics  . Smoking status: Never Smoker   . Smokeless tobacco: None  . Alcohol Use: No    Review of Systems All other systems negative except as documented in the HPI. All pertinent positives and negatives as reviewed in the HPI.   Allergies  Review of patient's allergies indicates no known allergies.  Home Medications   Prior to Admission medications   Medication Sig Start Date End Date Taking? Authorizing Provider  BIOTIN PO Take 1 tablet by mouth daily.   Yes Historical Provider, MD  ibuprofen (ADVIL,MOTRIN) 200 MG tablet Take 200-400 mg by mouth every 6 (six) hours as needed for headache, mild pain or moderate pain.   Yes Historical Provider, MD  amoxicillin-clavulanate (AUGMENTIN) 875-125 MG per tablet Take 1 tablet by mouth 2 (two) times daily. Patient not taking: Reported on 11/13/2015 06/04/15   Charm RingsErin J Honig, MD     BP 127/76 mmHg  Pulse 63  Temp(Src) 98.4 F (36.9 C) (Oral)  Resp 20  Ht 6\' 7"  (2.007 m)  Wt 95.255 kg  BMI 23.65 kg/m2  SpO2 100% Physical Exam  Constitutional: He is oriented to person, place, and time. He appears well-developed and well-nourished. No distress.  Cardiovascular: Normal heart sounds.   Musculoskeletal:       Left wrist: He exhibits decreased range of motion, tenderness, bony tenderness and swelling. He exhibits no deformity and no laceration.       Arms: Neurological: He is alert and oriented to person, place, and time. He exhibits normal muscle tone. Coordination normal.  Skin: Skin is warm and dry.    ED Course  Procedures (including critical care time) Labs Review Labs Reviewed - No data to display  Imaging Review Dg Wrist Complete Left  11/12/2015  CLINICAL DATA:  Larey SeatFell playing basketball five days ago. Persistent left wrist pain. EXAM: LEFT WRIST - COMPLETE 3+ VIEW COMPARISON:  None. FINDINGS: The joint spaces are maintained.  No acute fracture is identified. IMPRESSION: No acute bony findings. Electronically Signed   By: Rudie MeyerP.  Gallerani M.D.   On: 11/12/2015 17:20   I have personally reviewed and evaluated these images and lab results as part of my medical decision-making. Patient the patient's mechanism of falling on outstretched hand, along with the fact that he has snuffbox tenderness.  He will be placed in a splint and referred to hand surgery.  Told to return here as needed.  Patient agrees plan and all questions were answered.  I advised him there is a chance for an occult fracture   Charlestine Night, PA-C 11/14/15 0206  April Palumbo, MD 11/14/15 [phone]

## 2015-11-14 NOTE — Progress Notes (Signed)
Orthopedic Tech Progress Note Patient Details:  Freddi CheJoshua E Bal 12/22/1990 [id number] Applied fiberglass sugar tong splint to LUE.  Pulses, sensation, motion intact before and after splinting.  Capillary refill less than 2 seconds before and after splinting.  Placed splinted LUE in arm sling. Ortho Devices Type of Ortho Device: Sugartong splint, Arm sling Ortho Device/Splint Location: LUE Ortho Device/Splint Interventions: Application   Lesle ChrisGilliland, Simrat Kendrick L 11/14/2015, 2:29 AM

## 2015-11-14 NOTE — Discharge Instructions (Signed)
Return here as needed.  We are going to treat this like there may be a possible fracture that is not seen on x-ray.  Follow-up with the hand surgeon, as soon as possible.  Ice and elevate the wrist

## 2015-12-09 ENCOUNTER — Encounter (HOSPITAL_COMMUNITY): Payer: Self-pay | Admitting: Emergency Medicine

## 2015-12-09 ENCOUNTER — Emergency Department (HOSPITAL_COMMUNITY)
Admission: EM | Admit: 2015-12-09 | Discharge: 2015-12-09 | Disposition: A | Discharge status: Home / Self Care | Payer: BLUE CROSS/BLUE SHIELD | Attending: Emergency Medicine | Admitting: Emergency Medicine

## 2015-12-09 DIAGNOSIS — X58XXXD Exposure to other specified factors, subsequent encounter: Secondary | ICD-10-CM | POA: Insufficient documentation

## 2015-12-09 DIAGNOSIS — S6992XD Unspecified injury of left wrist, hand and finger(s), subsequent encounter: Secondary | ICD-10-CM | POA: Diagnosis not present

## 2015-12-09 DIAGNOSIS — Z79899 Other long term (current) drug therapy: Secondary | ICD-10-CM | POA: Diagnosis not present

## 2015-12-09 NOTE — ED Notes (Signed)
Pt reports left wrist injury; brace removed last week; pt reports pain with heavy lifting and would like evaluation prior to returning to work.

## 2015-12-09 NOTE — ED Provider Notes (Signed)
CSN: [id number]     Arrival date & time 12/09/15  1634 History  By signing my name below, I, Eric Mcclain, attest that this documentation has been prepared under the direction and in the presence of Sealed Air CorporationHeather Naveen Clardy, PA-C. Electronically Signed: Phillis HaggisGabriella Mcclain, ED Scribe. 12/09/2015. 6:15 PM.   Chief Complaint  Patient presents with  . Wrist Pain   The history is provided by the patient. No language interpreter was used.  HPI Comments: Eric CheJoshua E Mcclain is a 25 y.o. Male with a hx of tendon laceration who presents to the Emergency Department for follow up of a wrist injury that occurred one month ago.   Pt was previously seen in the ED for a left wrist injury one month ago and had a splint placed.  He had xrays done, which were negative.  However, he had some snuffbox tenderness on exam, therefore, scaphoid fracture was suspected.    He called a Hydrographic surveyorHand Surgeon but was unable to be seen until February.  Pt states that he removed the splint last week.  He had one episode of pain while lifting a washing machine two days ago, which has since resolved.   He denies pain at this time.  He was able to play basketball this weekend without any pain. He would like to be evaluated prior to returning to work. He denies joint swelling, wound, color change, numbness, or weakness.    Past Medical History  Diagnosis Date  . Tendon laceration    Past Surgical History  Procedure Laterality Date  . Finger surgery Right 2006    fx ring finger   Family History  Problem Relation Age of Onset  . Diabetes Mother    Social History  Substance Use Topics  . Smoking status: Never Smoker   . Smokeless tobacco: None  . Alcohol Use: No    Review of Systems  Musculoskeletal: Negative for joint swelling.  Skin: Negative for color change and wound.  Neurological: Negative for weakness and numbness.   Allergies  Review of patient's allergies indicates no known allergies.  Home Medications   Prior to Admission  medications   Medication Sig Start Date End Date Taking? Authorizing Provider  BIOTIN PO Take 1 tablet by mouth daily.   Yes Historical Provider, MD  HYDROcodone-acetaminophen (NORCO/VICODIN) 5-325 MG tablet Take 1 tablet by mouth every 6 (six) hours as needed for moderate pain. 11/14/15  Yes Christopher Lawyer, PA-C  ibuprofen (ADVIL,MOTRIN) 800 MG tablet Take 1 tablet (800 mg total) by mouth every 8 (eight) hours as needed. Patient not taking: Reported on 12/09/2015 11/14/15   Charlestine Nighthristopher Lawyer, PA-C   BP 127/94 mmHg  Pulse 67  Temp(Src) 98 F (36.7 C) (Oral)  SpO2 98% Physical Exam  Constitutional: He is oriented to person, place, and time. He appears well-developed and well-nourished. No distress.  HENT:  Head: Normocephalic and atraumatic.  Mouth/Throat: Oropharynx is clear and moist. No oropharyngeal exudate.  Eyes: Conjunctivae and EOM are normal. Pupils are equal, round, and reactive to light.  Neck: Normal range of motion. Neck supple.  Cardiovascular: Normal rate and regular rhythm.   Pulmonary/Chest: Effort normal and breath sounds normal.  Musculoskeletal: Normal range of motion.  Left wrist: no pain with ROM, no TTP, no snuff box tenderness, no erythema or edema; distal sensation all of fingers of the left hand intact  Neurological: He is alert and oriented to person, place, and time.  Skin: Skin is warm and dry.  Psychiatric: He has a normal  mood and affect. His behavior is normal.    ED Course  Procedures (including critical care time) DIAGNOSTIC STUDIES: Oxygen Saturation is 98% on RA, normal by my interpretation.    COORDINATION OF CARE: 6:35 PM-Discussed treatment plan which includes work note with pt at bedside and pt agreed to plan.    Labs Review Labs Reviewed - No data to display  Imaging Review No results found. I have personally reviewed and evaluated these images and lab results as part of my medical decision-making.   EKG Interpretation None       MDM   Final diagnoses:  None  Patient presents today for follow up for a wrist injury that occurred one month ago.  He reports that xrays were negative.  Review of previous ED note shows that he had snuffbox tenderness on exam.  Therefore, he was put in a splint for suspected scaphoid fracture and instructed to follow up with Hand Surgery, which he did not do.  He reports that he removed the splint last week.  He denies any pain at this time.  No snuffbox tenderness on exam today.  Full ROM of the wrist without pain.  Patient declined follow up xray.  Feel that the patient is stable for discharge.  Return precautions given. I personally performed the services described in this documentation, which was scribed in my presence. The recorded information has been reviewed and is accurate.    Santiago Glad, PA-C 12/10/15 2225  Lavera Guise, MD 12/11/15 2012

## 2015-12-09 NOTE — ED Notes (Signed)
Patient was alert, oriented and stable upon discharge. RN went over AVS and patient had no further questions.  

## 2015-12-09 NOTE — Discharge Instructions (Signed)
Return to the Emergency Department if symptoms return or worsen.

## 2016-06-09 ENCOUNTER — Emergency Department (HOSPITAL_COMMUNITY)
Admission: EM | Admit: 2016-06-09 | Discharge: 2016-06-10 | Disposition: A | Discharge status: Home / Self Care | Payer: BLUE CROSS/BLUE SHIELD | Attending: Emergency Medicine | Admitting: Emergency Medicine

## 2016-06-09 ENCOUNTER — Encounter (HOSPITAL_COMMUNITY): Payer: Self-pay | Admitting: Emergency Medicine

## 2016-06-09 ENCOUNTER — Emergency Department (HOSPITAL_COMMUNITY): Payer: BLUE CROSS/BLUE SHIELD

## 2016-06-09 DIAGNOSIS — S66912D Strain of unspecified muscle, fascia and tendon at wrist and hand level, left hand, subsequent encounter: Secondary | ICD-10-CM

## 2016-06-09 DIAGNOSIS — Y999 Unspecified external cause status: Secondary | ICD-10-CM | POA: Insufficient documentation

## 2016-06-09 DIAGNOSIS — Y9389 Activity, other specified: Secondary | ICD-10-CM | POA: Insufficient documentation

## 2016-06-09 DIAGNOSIS — S66912A Strain of unspecified muscle, fascia and tendon at wrist and hand level, left hand, initial encounter: Secondary | ICD-10-CM | POA: Insufficient documentation

## 2016-06-09 DIAGNOSIS — Y929 Unspecified place or not applicable: Secondary | ICD-10-CM | POA: Insufficient documentation

## 2016-06-09 DIAGNOSIS — X500XXA Overexertion from strenuous movement or load, initial encounter: Secondary | ICD-10-CM | POA: Insufficient documentation

## 2016-06-09 NOTE — ED Notes (Signed)
Patient complaining of left wrist pain. Patient injured it on Sunday. Patient says when he left something it hurts.

## 2016-06-09 NOTE — ED Notes (Signed)
Pt ambulated to restroom. 

## 2016-06-09 NOTE — ED Provider Notes (Signed)
CSN: [id number]     Arrival date & time 06/09/16  2333 History  By signing my name below, I, Eric Mcclain, attest that this documentation has been prepared under the direction and in the presence of Eric Favor, NP. Electronically Signed: Evon Mcclain, ED Scribe. 06/09/2016. 11:50 PM.      Chief Complaint  Patient presents with  . Wrist Pain   The history is provided by the patient. No language interpreter was used.   HPI Comments: Eric Mcclain is a 25 y.o. male who presents to the Emergency Department complaining of left wrist pain onset 2 days prior. Pt reports that 2 days prior he noticed pain while lifting weights. Pt doesn't report any modifying factors. He denies any medications PTA. Pt doesn't report numbness or tingling. Pt reports Hx of left wrist sprain December 2016.    Past Medical History  Diagnosis Date  . Tendon laceration    Past Surgical History  Procedure Laterality Date  . Finger surgery Right 2006    fx ring finger   Family History  Problem Relation Age of Onset  . Diabetes Mother    Social History  Substance Use Topics  . Smoking status: Never Smoker   . Smokeless tobacco: None  . Alcohol Use: No    Review of Systems  Musculoskeletal: Positive for arthralgias. Negative for joint swelling.  Neurological: Negative for numbness.  All other systems reviewed and are negative.    Allergies  Review of patient's allergies indicates no known allergies.  Home Medications   Prior to Admission medications   Medication Sig Start Date End Date Taking? Authorizing Provider  BIOTIN PO Take 1 tablet by mouth daily.    Historical Provider, MD  HYDROcodone-acetaminophen (NORCO/VICODIN) 5-325 MG tablet Take 1 tablet by mouth every 6 (six) hours as needed for moderate pain. 11/14/15   Charlestine Night, PA-C  ibuprofen (ADVIL,MOTRIN) 800 MG tablet Take 1 tablet (800 mg total) by mouth every 8 (eight) hours as needed. Patient not taking: Reported on 12/09/2015  11/14/15   Charlestine Night, PA-C   BP 123/71 mmHg  Pulse 68  Temp(Src) 98.6 F (37 C) (Oral)  Resp 18  Ht  (2.007 m)  Wt 225 lb (102.059 kg)  BMI 25.34 kg/m2  SpO2 100%   Physical Exam  Constitutional: He is oriented to person, place, and time. He appears well-developed and well-nourished. No distress.  HENT:  Head: Normocephalic and atraumatic.  Eyes: Conjunctivae and EOM are normal.  Neck: Neck supple. No tracheal deviation present.  Cardiovascular: Normal rate.   Pulmonary/Chest: Effort normal. No respiratory distress.  Musculoskeletal: Normal range of motion. He exhibits tenderness.       Left wrist: He exhibits tenderness. He exhibits normal range of motion, no bony tenderness, no swelling, no deformity and no laceration.       Arms: Neurological: He is alert and oriented to person, place, and time.  Skin: Skin is warm and dry.  Psychiatric: He has a normal mood and affect. His behavior is normal.  Nursing note and vitals reviewed.   ED Course  Procedures (including critical care time) DIAGNOSTIC STUDIES: Oxygen Saturation is 100% on RA, normal by my interpretation.    COORDINATION OF CARE: 12:09 AM-Discussed treatment plan which includes x-ray with pt at bedside and pt agreed to plan.     Labs Review Labs Reviewed - No data to display  Imaging Review No results found.    EKG Interpretation None    PAtient has  been placed in an ACE wrap and encouraged to wear when weight lifting   MDM   Final diagnoses:  None     I personally performed the services described in this documentation, which was scribed in my presence. The recorded information has been reviewed and is accurate.     Eric FavorGail Chalisa Kobler, NP 06/14/16 2056  Raeford RazorStephen Kohut, MD 06/20/16 [phone]

## 2016-06-10 NOTE — Discharge Instructions (Signed)
Your xray is normal I would suggest that you wear a brace,ACE wrap when lifting weights for additional support in the future You have been given "rehab" exercises to help strengthen the area as well a referral to orthopedics for evaluation as this has been a recurrent injury over the past 6 months

## 2016-06-27 ENCOUNTER — Emergency Department (HOSPITAL_COMMUNITY)
Admission: EM | Admit: 2016-06-27 | Discharge: 2016-06-27 | Disposition: A | Discharge status: Home / Self Care | Payer: BLUE CROSS/BLUE SHIELD | Attending: Emergency Medicine | Admitting: Emergency Medicine

## 2016-06-27 ENCOUNTER — Encounter (HOSPITAL_COMMUNITY): Payer: Self-pay

## 2016-06-27 ENCOUNTER — Emergency Department (HOSPITAL_COMMUNITY): Payer: BLUE CROSS/BLUE SHIELD

## 2016-06-27 DIAGNOSIS — Y939 Activity, unspecified: Secondary | ICD-10-CM | POA: Insufficient documentation

## 2016-06-27 DIAGNOSIS — W2201XA Walked into wall, initial encounter: Secondary | ICD-10-CM | POA: Insufficient documentation

## 2016-06-27 DIAGNOSIS — Y929 Unspecified place or not applicable: Secondary | ICD-10-CM | POA: Insufficient documentation

## 2016-06-27 DIAGNOSIS — S62141A Displaced fracture of body of hamate [unciform] bone, right wrist, initial encounter for closed fracture: Secondary | ICD-10-CM | POA: Insufficient documentation

## 2016-06-27 DIAGNOSIS — Y999 Unspecified external cause status: Secondary | ICD-10-CM | POA: Insufficient documentation

## 2016-06-27 MED ORDER — TRAMADOL HCL 50 MG PO TABS: 50.0000 mg | ORAL_TABLET | Freq: Four times a day (QID) | ORAL | 0 refills | Status: DC | PRN

## 2016-06-27 MED ORDER — IBUPROFEN 800 MG PO TABS: 800.0000 mg | ORAL_TABLET | Freq: Three times a day (TID) | ORAL | 0 refills | Status: DC

## 2016-06-27 NOTE — ED Provider Notes (Addendum)
[facility]-EMERGENCY DEPT Provider Note   CSN: [id number] Arrival date & time: 06/27/16  7169  First Provider Contact:  First MD Initiated Contact with Patient 06/27/16 [phone]        History   Chief Complaint Chief Complaint  Patient presents with  . Hand Injury    HPI Eric Mcclain is a 25 y.o. male.  Patient presents to the emergency room for evaluation of right hand injury. Patient reports that he punched a wall approximately 30 minutes before coming to the emergency department. He thinks that his knuckles were shifted down to wear his wrist is but that he was able to push them back into place. Patient complaining of diffuse hand pain since the incident. Pain worsens with any movement of the hand.   The history is provided by the patient.    Past Medical History:  Diagnosis Date  . Tendon laceration     There are no active problems to display for this patient.   Past Surgical History:  Procedure Laterality Date  . FINGER SURGERY Right 2006   fx ring finger       Home Medications    Prior to Admission medications   Medication Sig Start Date End Date Taking? Authorizing Provider  BIOTIN PO Take 1 tablet by mouth daily.    Historical Provider, MD  ibuprofen (ADVIL,MOTRIN) 800 MG tablet Take 1 tablet (800 mg total) by mouth 3 (three) times daily. 06/27/16   Gilda Crease, MD  traMADol (ULTRAM) 50 MG tablet Take 1 tablet (50 mg total) by mouth every 6 (six) hours as needed. 06/27/16   Gilda Crease, MD    Family History Family History  Problem Relation Age of Onset  . Diabetes Mother     Social History Social History  Substance Use Topics  . Smoking status: Never Smoker  . Smokeless tobacco: Never Used  . Alcohol use No     Allergies   Review of patient's allergies indicates no known allergies.   Review of Systems Review of Systems  Musculoskeletal:       Hand pain  All other systems reviewed and are negative.    Physical Exam Updated  Vital Signs BP 127/63 (BP Location: Left Arm)   Pulse 66   Temp 99 F (37.2 C) (Oral)   Resp 18   Ht 6\' 7"  (2.007 m)   Wt 220 lb (99.8 kg)   SpO2 99%   BMI 24.78 kg/m   Physical Exam  Constitutional: He is oriented to person, place, and time. He appears well-developed and well-nourished. No distress.  HENT:  Head: Normocephalic and atraumatic.  Right Ear: Hearing normal.  Left Ear: Hearing normal.  Nose: Nose normal.  Mouth/Throat: Oropharynx is clear and moist and mucous membranes are normal.  Eyes: Conjunctivae and EOM are normal. Pupils are equal, round, and reactive to light.  Neck: Normal range of motion. Neck supple.  Cardiovascular: Regular rhythm, S1 normal and S2 normal.  Exam reveals no gallop and no friction rub.   No murmur heard. Pulmonary/Chest: Effort normal and breath sounds normal. No respiratory distress. He exhibits no tenderness.  Abdominal: Soft. Normal appearance and bowel sounds are normal. There is no hepatosplenomegaly. There is no tenderness. There is no rebound, no guarding, no tenderness at McBurney's point and negative Murphy's sign. No hernia.  Musculoskeletal: Normal range of motion.       Right hand: He exhibits tenderness and swelling. He exhibits normal range of motion, normal capillary refill, no  deformity and no laceration.       Hands: Neurological: He is alert and oriented to person, place, and time. He has normal strength. No cranial nerve deficit or sensory deficit. Coordination normal. GCS eye subscore is 4. GCS verbal subscore is 5. GCS motor subscore is 6.  Skin: Skin is warm, dry and intact. No rash noted. No cyanosis.  Psychiatric: He has a normal mood and affect. His speech is normal and behavior is normal. Thought content normal.  Nursing note and vitals reviewed.    ED Treatments / Results  Labs (all labs ordered are listed, but only abnormal results are displayed) Labs Reviewed - No data to display  EKG  EKG  Interpretation None       Radiology Dg Wrist Complete Right  Result Date: 06/27/2016 CLINICAL DATA:  Punched a wall tonight, pain and swelling. EXAM: RIGHT WRIST - COMPLETE 3+ VIEW COMPARISON:  RIGHT hand radiograph June 27, 2016 at 0603 hours FINDINGS: Tiny bony fragment at the fifth carpometacarpal joints face, no donor site. No dislocation. No destructive bony lesions. Dorsal wrist soft tissue swelling without subcutaneous gas nor radiopaque foreign bodies. IMPRESSION: Acute fracture fragment at fifth carpometacarpal joint space compatible with avulsion injury, no donor site. No dislocation. Electronically Signed   By: Awilda Metro M.D.   On: 06/27/2016 06:43   Dg Hand Complete Right  Result Date: 06/27/2016 CLINICAL DATA:  25 year old male with right hand injury. EXAM: RIGHT HAND - COMPLETE 3+ VIEW COMPARISON:  Radiograph dated 05/28/2014 FINDINGS: There is faint linear bone density along the dorsal aspect of the cortex of a carpal bone, likely hamate, seen on the oblique and lateral projection is concerning for a nondisplaced cortical fracture. There is associated overlying soft tissue swelling. No other acute fracture identified. There is no dislocation. There is stable flexion deformity of the distal interphalangeal joints of left fourth digit. An old bony fragment with nonunion may be present at the base of the distal phalanx of the fourth digit. The bones are well mineralized. No arthritic changes. IMPRESSION: Findings concerning fracture of the posterior hamate cortex. Clinical correlation is recommended. Electronically Signed   By: Elgie Collard M.D.   On: 06/27/2016 06:18    Procedures Procedures (including critical care time)  Medications Ordered in ED Medications - No data to display   Initial Impression / Assessment and Plan / ED Course  I have reviewed the triage vital signs and the nursing notes.  Pertinent labs & imaging results that were available during my care of  the patient were reviewed by me and considered in my medical decision making (see chart for details).  Clinical Course  Patient presents to the emergency department with complaints of right hand injury after punching a wall. X-ray concerning for disruption of the cortex of the hamate bone. He does have swelling and tenderness in this area. Patient will be placed in a splint, follow-up with hand surgery.  Final Clinical Impressions(s) / ED Diagnoses   Final diagnoses:  Closed hamate fracture, right, initial encounter    New Prescriptions New Prescriptions   IBUPROFEN (ADVIL,MOTRIN) 800 MG TABLET    Take 1 tablet (800 mg total) by mouth 3 (three) times daily.   TRAMADOL (ULTRAM) 50 MG TABLET    Take 1 tablet (50 mg total) by mouth every 6 (six) hours as needed.     Gilda Crease, MD 06/27/16 1610    Gilda Crease, MD 06/27/16 [phone]

## 2016-06-27 NOTE — ED Triage Notes (Signed)
Patient states that he "punched a wall" about 30 minutes ago injuring his right hand.  Patient states that knuckles were out of place and he "pushed" them back into place.  Patient has radial pulses and movement in digits and at the wrist.  Patient rates pain 7/10.

## 2019-01-22 ENCOUNTER — Other Ambulatory Visit: Payer: Self-pay

## 2019-01-22 ENCOUNTER — Encounter (HOSPITAL_COMMUNITY): Payer: Self-pay | Admitting: Emergency Medicine

## 2019-01-22 ENCOUNTER — Emergency Department (HOSPITAL_COMMUNITY)
Admission: EM | Admit: 2019-01-22 | Discharge: 2019-01-23 | Disposition: A | Discharge status: Home / Self Care | Payer: BLUE CROSS/BLUE SHIELD | Attending: Emergency Medicine | Admitting: Emergency Medicine

## 2019-01-22 DIAGNOSIS — S39012A Strain of muscle, fascia and tendon of lower back, initial encounter: Secondary | ICD-10-CM

## 2019-01-22 DIAGNOSIS — Y929 Unspecified place or not applicable: Secondary | ICD-10-CM | POA: Insufficient documentation

## 2019-01-22 DIAGNOSIS — Z79899 Other long term (current) drug therapy: Secondary | ICD-10-CM | POA: Insufficient documentation

## 2019-01-22 DIAGNOSIS — Y999 Unspecified external cause status: Secondary | ICD-10-CM | POA: Insufficient documentation

## 2019-01-22 DIAGNOSIS — Y9389 Activity, other specified: Secondary | ICD-10-CM | POA: Insufficient documentation
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
## 2019-01-22 NOTE — ED Notes (Addendum)
Pt ambulated from triage to room 16 without assistance. Gait steady

## 2019-01-23 MED ORDER — IBUPROFEN 800 MG PO TABS: 800.0000 mg | ORAL_TABLET | Freq: Three times a day (TID) | ORAL | 0 refills | Status: DC | PRN

## 2019-01-23 MED ORDER — METHOCARBAMOL 500 MG PO TABS: 500.0000 mg | ORAL_TABLET | Freq: Three times a day (TID) | ORAL | 0 refills | Status: DC | PRN
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
## 2019-01-23 NOTE — ED Notes (Signed)
Pt verbalized discharge instructions and follow up care. Alert and ambulatory  

## 2019-04-08 ENCOUNTER — Emergency Department (HOSPITAL_COMMUNITY): Payer: No Typology Code available for payment source

## 2019-04-08 ENCOUNTER — Other Ambulatory Visit: Payer: Self-pay

## 2019-04-08 ENCOUNTER — Encounter (HOSPITAL_COMMUNITY): Payer: Self-pay | Admitting: Emergency Medicine

## 2019-04-08 ENCOUNTER — Emergency Department (HOSPITAL_COMMUNITY)
Admission: EM | Admit: 2019-04-08 | Discharge: 2019-04-08 | Disposition: A | Discharge status: Home / Self Care | Payer: No Typology Code available for payment source | Attending: Emergency Medicine | Admitting: Emergency Medicine

## 2019-04-08 DIAGNOSIS — Y9389 Activity, other specified: Secondary | ICD-10-CM | POA: Insufficient documentation

## 2019-04-08 DIAGNOSIS — Y999 Unspecified external cause status: Secondary | ICD-10-CM | POA: Insufficient documentation

## 2019-04-08 DIAGNOSIS — S060X1A Concussion with loss of consciousness of 30 minutes or less, initial encounter: Secondary | ICD-10-CM | POA: Insufficient documentation

## 2019-04-08 DIAGNOSIS — H539 Unspecified visual disturbance: Secondary | ICD-10-CM | POA: Diagnosis not present

## 2019-04-08 DIAGNOSIS — S50812A Abrasion of left forearm, initial encounter: Secondary | ICD-10-CM | POA: Diagnosis not present

## 2019-04-08 DIAGNOSIS — Y92410 Unspecified street and highway as the place of occurrence of the external cause: Secondary | ICD-10-CM | POA: Diagnosis not present

## 2019-04-08 DIAGNOSIS — M25532 Pain in left wrist: Secondary | ICD-10-CM | POA: Insufficient documentation

## 2019-04-08 DIAGNOSIS — M25522 Pain in left elbow: Secondary | ICD-10-CM | POA: Insufficient documentation

## 2019-04-08 DIAGNOSIS — S025XXA Fracture of tooth (traumatic), initial encounter for closed fracture: Secondary | ICD-10-CM | POA: Diagnosis not present

## 2019-04-08 DIAGNOSIS — Z79899 Other long term (current) drug therapy: Secondary | ICD-10-CM | POA: Insufficient documentation

## 2019-04-08 DIAGNOSIS — M545 Low back pain: Secondary | ICD-10-CM | POA: Diagnosis not present

## 2019-04-08 DIAGNOSIS — M79602 Pain in left arm: Secondary | ICD-10-CM

## 2019-04-08 DIAGNOSIS — S0990XA Unspecified injury of head, initial encounter: Secondary | ICD-10-CM | POA: Diagnosis present

## 2019-04-08 LAB — BASIC METABOLIC PANEL
Anion gap: 9 (ref 5–15)
BUN: 10 mg/dL (ref 6–20)
CO2: 26 mmol/L (ref 22–32)
Calcium: 9.3 mg/dL (ref 8.9–10.3)
Chloride: 103 mmol/L (ref 98–111)
Creatinine, Ser: 1.23 mg/dL (ref 0.61–1.24)
GFR calc Af Amer: >60 mL/min (ref >60)
GFR calc non Af Amer: >60 mL/min (ref >60)
Glucose, Bld: 85 mg/dL (ref 70–99)
Potassium: 3.8 mmol/L (ref 3.5–5.1)
Sodium: 138 mmol/L (ref 135–145)

## 2019-04-08 LAB — URINALYSIS, ROUTINE W REFLEX MICROSCOPIC
Bilirubin Urine: NEGATIVE
Glucose, UA: NEGATIVE mg/dL
Hgb urine dipstick: NEGATIVE
Ketones, ur: 80 mg/dL — AB
Leukocytes,Ua: NEGATIVE
Nitrite: NEGATIVE
Protein, ur: 30 mg/dL — AB
Specific Gravity, Urine: 1.024 (ref 1.005–1.030)
pH: 5 (ref 5.0–8.0)

## 2019-04-08 LAB — CBC WITH DIFFERENTIAL/PLATELET
Abs Immature Granulocytes: 0.03 10*3/uL (ref 0.00–0.07)
Basophils Absolute: 0.1 10*3/uL (ref 0.0–0.1)
Basophils Relative: 1 %
Eosinophils Absolute: 0.1 10*3/uL (ref 0.0–0.5)
Eosinophils Relative: 2 %
HCT: 46.1 % (ref 39.0–52.0)
Hemoglobin: 15.2 g/dL (ref 13.0–17.0)
Immature Granulocytes: 0 %
Lymphocytes Relative: 26 %
Lymphs Abs: 2 10*3/uL (ref 0.7–4.0)
MCH: 27.8 pg (ref 26.0–34.0)
MCHC: 33 g/dL (ref 30.0–36.0)
MCV: 84.4 fL (ref 80.0–100.0)
Monocytes Absolute: 0.6 10*3/uL (ref 0.1–1.0)
Monocytes Relative: 8 %
Neutro Abs: 4.8 10*3/uL (ref 1.7–7.7)
Neutrophils Relative %: 63 %
Platelets: 269 10*3/uL (ref 150–400)
RBC: 5.46 MIL/uL (ref 4.22–5.81)
RDW: 13.2 % (ref 11.5–15.5)
WBC: 7.6 10*3/uL (ref 4.0–10.5)
nRBC: 0 % (ref 0.0–0.2)

## 2019-04-08 LAB — RAPID URINE DRUG SCREEN, HOSP PERFORMED
Amphetamines: NOT DETECTED
Barbiturates: NOT DETECTED
Benzodiazepines: NOT DETECTED
Cocaine: NOT DETECTED
Opiates: NOT DETECTED
Tetrahydrocannabinol: NOT DETECTED

## 2019-04-08 MED ORDER — TETANUS-DIPHTH-ACELL PERTUSSIS 5-2.5-18.5 LF-MCG/0.5 IM SUSP
0.5000 mL | Freq: Once | INTRAMUSCULAR | Status: DC
  Filled 2019-04-08: qty 0.5
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
## 2019-04-08 NOTE — ED Notes (Signed)
Bed: WTR9 Expected date:  Expected time:  Means of arrival:  Comments: 
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
## 2019-04-10 LAB — URINE CULTURE: Culture: NO GROWTH

## 2019-04-12 ENCOUNTER — Emergency Department (HOSPITAL_COMMUNITY): Payer: No Typology Code available for payment source

## 2019-04-12 ENCOUNTER — Encounter (HOSPITAL_COMMUNITY): Payer: Self-pay | Admitting: Emergency Medicine

## 2019-04-12 ENCOUNTER — Emergency Department (HOSPITAL_COMMUNITY)
Admission: EM | Admit: 2019-04-12 | Discharge: 2019-04-13 | Disposition: A | Discharge status: Home / Self Care | Payer: No Typology Code available for payment source | Attending: Emergency Medicine | Admitting: Emergency Medicine

## 2019-04-12 ENCOUNTER — Other Ambulatory Visit: Payer: Self-pay

## 2019-04-12 DIAGNOSIS — Z79899 Other long term (current) drug therapy: Secondary | ICD-10-CM | POA: Insufficient documentation

## 2019-04-12 DIAGNOSIS — M542 Cervicalgia: Secondary | ICD-10-CM | POA: Diagnosis not present

## 2019-04-12 DIAGNOSIS — R51 Headache: Secondary | ICD-10-CM | POA: Insufficient documentation

## 2019-04-12 DIAGNOSIS — Y9389 Activity, other specified: Secondary | ICD-10-CM | POA: Diagnosis not present

## 2019-04-12 DIAGNOSIS — Y999 Unspecified external cause status: Secondary | ICD-10-CM | POA: Diagnosis not present

## 2019-04-12 DIAGNOSIS — Y92410 Unspecified street and highway as the place of occurrence of the external cause: Secondary | ICD-10-CM | POA: Insufficient documentation

## 2019-04-12 MED ORDER — LIDOCAINE 5 % EX PTCH
2.0000 | MEDICATED_PATCH | CUTANEOUS | Status: DC
  Administered 2019-04-12: 2 via TRANSDERMAL
  Filled 2019-04-12: qty 2

## 2019-04-12 MED ORDER — ACETAMINOPHEN 500 MG PO TABS
1000.0000 mg | ORAL_TABLET | Freq: Once | ORAL | Status: AC
  Administered 2019-04-12: 1000 mg via ORAL
  Filled 2019-04-12: qty 2
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
## 2019-04-13 ENCOUNTER — Encounter (HOSPITAL_COMMUNITY): Payer: Self-pay | Admitting: Emergency Medicine
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
## 2019-04-13 NOTE — ED Notes (Signed)
Verbalized discharge instructions and follow up care. Alert and ambulatory.  

## 2019-04-14 ENCOUNTER — Other Ambulatory Visit: Payer: Self-pay

## 2019-04-14 ENCOUNTER — Encounter: Payer: Self-pay | Admitting: Primary Care

## 2019-04-14 ENCOUNTER — Ambulatory Visit: Payer: Self-pay | Attending: Primary Care | Admitting: Primary Care

## 2019-04-14 VITALS — BP 117/70 | HR 78 | Temp 98.6°F

## 2019-04-14 DIAGNOSIS — R42 Dizziness and giddiness: Secondary | ICD-10-CM

## 2019-04-14 DIAGNOSIS — Z09 Encounter for follow-up examination after completed treatment for conditions other than malignant neoplasm: Secondary | ICD-10-CM

## 2019-04-14 DIAGNOSIS — Z7689 Persons encountering health services in other specified circumstances: Secondary | ICD-10-CM

## 2019-04-14 MED ORDER — CYCLOBENZAPRINE HCL 10 MG PO TABS: 10.0000 mg | ORAL_TABLET | Freq: Three times a day (TID) | ORAL | 0 refills | Status: DC | PRN

## 2019-04-14 MED ORDER — IBUPROFEN 800 MG PO TABS: 600.0000 mg | ORAL_TABLET | Freq: Three times a day (TID) | ORAL | 1 refills | Status: DC | PRN
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
## 2019-04-14 NOTE — Progress Notes (Signed)
Patient verified DOB Patient has not taken medication today. Patient has not eaten today, Patient complains of pains in his head and feeling dizzy. Pain is still present in the left arm which was the arm he used to shield him self.

## 2019-04-20 ENCOUNTER — Ambulatory Visit: Payer: No Typology Code available for payment source | Attending: Primary Care

## 2019-04-20 ENCOUNTER — Other Ambulatory Visit: Payer: Self-pay

## 2019-04-20 DIAGNOSIS — M542 Cervicalgia: Secondary | ICD-10-CM

## 2019-04-20 DIAGNOSIS — M79632 Pain in left forearm: Secondary | ICD-10-CM | POA: Diagnosis present

## 2019-04-20 DIAGNOSIS — M545 Low back pain: Secondary | ICD-10-CM | POA: Insufficient documentation

## 2019-04-20 DIAGNOSIS — R293 Abnormal posture: Secondary | ICD-10-CM | POA: Diagnosis present

## 2019-04-20 DIAGNOSIS — M25622 Stiffness of left elbow, not elsewhere classified: Secondary | ICD-10-CM | POA: Insufficient documentation

## 2019-04-20 DIAGNOSIS — R252 Cramp and spasm: Secondary | ICD-10-CM | POA: Diagnosis present
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
## 2019-04-21 ENCOUNTER — Telehealth: Payer: Self-pay
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
## 2019-04-21 NOTE — Telephone Encounter (Signed)
Left message for patient to call back for visit in [facility]ussion [facility].

## 2019-04-23 NOTE — Progress Notes (Deleted)
Subjective:   @VITALSMCOMMENTS @  Chief Complaint: Eric Mcclain, DOB: 01-25-1991, is a 28 y.o. male who presents for No chief complaint on file.   Injury date : *** Visit #: ***  Previous imagine.  Patient did have cervical neck x-rays.  Independently visualized by me showing straightening of cervical lordosis but no significant bony abnormality  Patient also did have a CT of the head.  Negative CT head.  Maxillary sinuses did show that there was fragmentation of the right lower first molar  History of Present Illness:    Concussion Self-Reported Symptom Score Symptoms rated on a scale 1-6, in last 24 hours  Headache: ***    Nausea: ***  Vomiting: ***  Balance Difficulty: ***   Dizziness: ***  Fatigue: ***  Trouble Falling Asleep: ***   Sleep More Than Usual: ***  Sleep Less Than Usual: ***  Daytime Drowsiness: ***  Photophobia: ***  Phonophobia: ***  Feeling anxious: ***  Confused: ***  Irritability: ***  Sadness: ***  Nervousness: ***  Feeling More Emotional: ***  Numbness or Tingling: ***  Feeling Slowed Down: ***  Feeling Mentally Foggy: ***  Difficulty Concentrating: ***  Difficulty Remembering: ***  Visual Problems: ***  Neck Pain: ***  Tinnitus: ***   Total Symptom Score: *** Previous Symptom Score: ***  Review of Systems:  No , visual changes, nausea, vomiting, diarrhea, constipation, dizziness, abdominal pain, skin rash, fevers, chills, night sweats, weight loss, swollen lymph nodes, body aches, joint swelling, muscle aches, chest pain, shortness of breath, mood changes.   +Headache   Review of History: Past Medical History: @PMHP @  Past Surgical History:  has a past surgical history that includes Finger surgery (Right, 2006). Family History: family history includes Diabetes in his mother. no family history of autoimmune Social History:  reports that he has never smoked. He has never used smokeless tobacco. He reports current alcohol use. He  reports that he does not use drugs. Current Medications: has a current medication list which includes the following prescription(s): biotin, cyclobenzaprine, ibuprofen, and methocarbamol. Allergies: has No Known Allergies.  Objective:    Physical Examination There were no vitals filed for this visit. General: No apparent distress alert and oriented x3 mood and affect normal, dressed appropriately.  HEENT: Pupils equal, extraocular movements intact  Respiratory: Patient's speak in full sentences and does not appear short of breath  Cardiovascular: No lower extremity edema, non tender, no erythema  Skin: Warm dry intact with no signs of infection or rash on extremities or on axial skeleton.  Abdomen: Soft nontender  Neuro: Cranial nerves II through XII are intact, neurovascularly intact in all extremities with 2+ DTRs and 2+ pulses.  Lymph: No lymphadenopathy of posterior or anterior cervical chain or axillae bilaterally.  Gait normal with good balance and coordination.  MSK:  Non tender with full range of motion and good stability and symmetric strength and tone of shoulders, elbows, wrist,  knee and ankles bilaterally.  Psychiatric: Oriented X3, intact recent and remote memory, judgement and insight, normal mood and affect  Concussion testing performed today:  I spent *** minutes with patient discussing test and results including review of history and patient chart and  integration of patient data, interpretation of standardized test results and clinical data, clinical decision making, treatment planning and report,and interactive feedback to the patient with all of patients questions answered.    Neurocognitive testing (ImPACT):  Baseline:*** Post #1: ***   Verbal Memory Composite *** (***%) *** (***%)  Visual Memory Composite *** (***%) *** (***%)   Visual Motor Speed Composite *** (***%) *** (***%)   Reaction Time Composite *** (***%) *** (***%)   Cognitive Efficiency Index *** ***      Vestibular Screening:   Pre VOMS  HA Score: *** Pre VOMS  Dizziness Score: ***   Headache  Dizziness  Smooth Pursuits *** ***  H. Saccades *** ***  V. Saccades *** ***  H. VOR *** ***  V. VOR *** ***  Visual Motor Sensitivity *** ***  Accommodation Right: *** cm Left: *** cm *** ***  Convergence: *** cm Divergence: *** cm *** ***   Balance Screen: ***  Additional testing performed today: { :28529}   Assessment:    No diagnosis found.  Eric Mcclain presents with the following concussion subtypes. Cognitive Cervical Vestibular Ocular Migraine Anxiety/Mood   Plan:   Action/Discussion: Reviewed diagnosis, management options, expected outcomes, and the reasons for scheduled and emergent follow-up. Questions were adequately answered. Patient expressed verbal understanding and agreement with the following plan.      Participation in school/work: Patient is cleared to return to work/school and activities of daily living without restrictions.  Patient is not cleared to return to work/school until further notice.  Patient may return to work/school on ***, with the following restrictions/supports:  Extra Time:  Take mental rest breaks during the day as needed. Check for return of symptoms when participating in any activities that require a significant amount of attention or concentration.  Allow extra time to complete tasks.  Please allow *** weeks to make up missed assignments, test, quizzes.  Visual/Vestibular Accommodations in School:  Allow patient to eat lunch in quiet environment with 1-2 classmates.  Allow patient to leave class 5 minutes before end of period to avoid busy/noisy hallway.  Please provide any supplemental learning materials (power points, lecture notes, handouts, etc) in minimum size 18 font and allow/provide any auditory supplements to learning when possible (books on tape, audio tape lectures, etc) to limit visual stress in  the classroom.  Patient is cleared for auditory participation only. Patient is not cleared for homework, quizzes, or tests at this time.   Testing:  May begin taking tests/quizzes on *** with no more than one test/quiz per day.   No significant classroom or standardized testing until ***.  Home/Extracurricular:  Lessen work/homework load to allow adequate cognitive rest. Work *** minutes with intervals of *** minute breaks (total *** hours).  Limit visual stimulants including: driving, watching television/movies, reading, using cell phone, etc. - to ensure relative visual cognitive rest. NOT cleared for video or phone games. May participate *** minutes with intervals of *** minute breaks (total *** hours).    Active Treatment Strategies:  Fueling your brain is important for recovery. It is essential to stay well hydrated, aiming for half of your body weight in fluid ounces per day (100 lbs = 50 oz). We also recommend eating breakfast to start your day and focus on a well-balanced diet containing lean protein, 'good' fats, and complex carbohydrates. See your nutrition / hydration handout for more details.   Quality sleep is vital in your concussion recovery. We encourage lots of sleep for the first 24-72 hours after injury but following this period it is important to regulate your sleep cycle. We encourage 8 hours of quality sleep per night. See your sleep handout for more details and strategies to quality sleep.  I  Treating your vestibular and visual dysfunction will decrease your  recovery time and improve your symptoms. Begin your home vestibular exercise program as directed on your AVS.    Begin taking DHA supplement as directed.  .   Follow-up information:  Follow up appointment at [facility] .   Patient needs to arrive 30 minutes prior to appointment to complete the following tests: { :28378}.    Patient Education:  Reviewed with patient the risks (i.e, a repeat  concussion, post-concussion syndrome, second-impact syndrome) of returning to play prior to complete resolution, and thoroughly reviewed the signs and symptoms of concussion.Reviewed need for complete resolution of all symptoms, with rest AND exertion, prior to return to play.  Reviewed red flags for urgent medical evaluation: worsening symptoms, nausea/vomiting, intractable headache, musculoskeletal changes, focal neurological deficits.  [facility]'s Concussion Care Plan, which clearly outlines the plans stated above, was given to patient.   The above was documentation by clinical scribe has been reviewed and is accurate and complete   In addition to the time spent performing tests, I spent *** min face to face w/ pt with greater than 50% of that time in counseling on:   Reviewed with patient the risks (i.e, a repeat concussion, post-concussion syndrome, second-impact syndrome) of returning to play prior to complete resolution, and thoroughly reviewed the signs and symptoms of      concussion. Reviewedf need for complete resolution of all symptoms, with rest AND exertion, prior to return to play.  Reviewed red flags for urgent medical evaluation: worsening symptoms, nausea/vomiting, intractable headache, musculoskeletal changes, focal neurological deficits.  [facility]'s Concussion Care Plan, which clearly outlines the plans stated above, was given to patient   After Visit Summary printed out and provided to patient as appropriate.

## 2019-04-24 ENCOUNTER — Telehealth: Payer: Self-pay | Admitting: Neurology

## 2019-04-24 ENCOUNTER — Ambulatory Visit: Payer: Self-pay | Admitting: Family Medicine

## 2019-04-27 ENCOUNTER — Ambulatory Visit: Payer: No Typology Code available for payment source | Attending: Primary Care

## 2019-04-27 ENCOUNTER — Other Ambulatory Visit: Payer: Self-pay

## 2019-04-27 DIAGNOSIS — M25622 Stiffness of left elbow, not elsewhere classified: Secondary | ICD-10-CM | POA: Insufficient documentation

## 2019-04-27 DIAGNOSIS — M542 Cervicalgia: Secondary | ICD-10-CM | POA: Insufficient documentation

## 2019-04-27 DIAGNOSIS — R252 Cramp and spasm: Secondary | ICD-10-CM | POA: Insufficient documentation

## 2019-04-27 DIAGNOSIS — M79632 Pain in left forearm: Secondary | ICD-10-CM | POA: Insufficient documentation

## 2019-04-27 DIAGNOSIS — R293 Abnormal posture: Secondary | ICD-10-CM | POA: Diagnosis present

## 2019-04-27 NOTE — Therapy (Signed)
[facility] [address] [place], [place], [address] Phone: [phone]   Fax:  [phone]  Physical Therapy Treatment  Patient Details  Name: Eric Mcclain MRN: [id number] Date of Birth: 1991-04-10 Referring Provider (PT): Gwinda Passe NP   Encounter Date: 04/27/2019  PT End of Session - 04/27/19 0954    Visit Number  2    Number of Visits  12    Date for PT Re-Evaluation  06/02/19    Authorization Type  Med PAy    PT Start Time  0952    PT Stop Time  1045    PT Time Calculation (min)  53 min    Activity Tolerance  Patient tolerated treatment well    Behavior During Therapy  [facility] for tasks assessed/performed       Past Medical History:  Diagnosis Date  . Tendon laceration     Past Surgical History:  Procedure Laterality Date  . FINGER SURGERY Right 2006   fx ring finger    There were no vitals filed for this visit.  Subjective Assessment - 04/27/19 1001    Subjective  Doing some better.    Pain Score  6     Pain Location  Neck    Pain Orientation  Right    Pain Descriptors / Indicators  Burning    Pain Type  Acute pain    Pain Onset  1 to 4 weeks ago    Pain Frequency  Constant    Aggravating Factors   turning head  lifting     Pain Relieving Factors  meds    Pain Score  0    Pain Location  Arm         [facility] PT Assessment - 04/27/19 0001      Posture/Postural Control   Posture Comments  Lt arm normal movement and posture      AROM   Cervical - Right Side Bend  25    Cervical - Left Side Bend  25    Cervical - Right Rotation  45    Cervical - Left Rotation  45                   [facility] Adult PT Treatment/Exercise - 04/27/19 0001      Exercises   Exercises  Neck      Neck Exercises: Seated   Other Seated Exercise  gentle sie bend and rotation.  Able to move to       Modalities   Modalities  Moist Heat;Ultrasound      Moist Heat Therapy   Number Minutes Moist Heat  10  Minutes    Moist Heat Location  Cervical      Ultrasound   Ultrasound Location  RT neck 100% 1.6Wcm2    Ultrasound Goals  Pain      Manual Therapy   Manual Therapy  Soft tissue mobilization;Passive ROM;Manual Traction;Myofascial release    Soft tissue mobilization  neck  and shoulder     Myofascial Release  --    Passive ROM  gentle rotation and side bend    Manual Traction  neck               PT Short Term Goals - 04/20/19 1343      PT SHORT TERM GOAL #1   Title  He will be independent with initial Hep     Baseline  No program    Time  2  Period  Weeks    Status  New      PT SHORT TERM GOAL #2   Title  He will report Neck pain decr 20% or more     Baseline  variable neck pain to severe at times    Time  2    Period  Weeks    Status  New      PT SHORT TERM GOAL #3   Title  He will report LT arm pain decreased 25% or more    Baseline  varies to severe at times    Time  2    Period  Weeks    Status  New      PT SHORT TERM GOAL #4   Title  He will demo understanding of good sitting posture    Baseline  forewrd ahead slump sitting    Time  2    Period  Weeks    Status  New        PT Long Term Goals - 04/20/19 1345      PT LONG TERM GOAL #1   Title  He will report pain as intermittant and decreased at least 75% in neck and LT arm    Baseline  pain improved 10-25%     Time  6    Period  Weeks    Status  New      PT LONG TERM GOAL #2   Title  He wqill be independent with all hEp issued    Baseline  independent with initial hEP    Time  6    Period  Weeks      PT LONG TERM GOAL #3   Title  He will have smooth full active ROM LT arm and  able to lift 20 pouds with no to min increased pain  that does not linger so he can return to normal work tasks    Baseline  unable to lift    Time  6    Period  Weeks    Status  New      PT LONG TERM GOAL #4   Title  He will demo full ROM neck  to return to full work tasks    Baseline  neck ROM improved but  not normal    Time  6    Period  Weeks    Status  New            Plan - 04/27/19 [id number]    Clinical Impression Statement  His ROM is better and during treatment Lt arm was relaxed and in normal posture . On leaving he was holding arm but relaxed it after reminder.       PT Treatment/Interventions  Dry needling;Manual techniques;Passive range of motion;Taping;Patient/family education;Therapeutic exercise;Iontophoresis 4mg /ml Dexamethasone;Traction;Moist Heat;Ultrasound;Electrical Stimulation    PT Next Visit Plan  Manual and modalities. HEP    PT Home Exercise Plan  Asked him to gently do ROm in comfortable ROM smooth and relaxed    Consulted and Agree with Plan of Care  Patient       Patient will benefit from skilled therapeutic intervention in order to improve the following deficits and impairments:  Pain, Increased muscle spasms, Decreased activity tolerance, Impaired UE functional use, Decreased range of motion  Visit Diagnosis: Cervicalgia  Pain in left forearm  Stiffness of left upper arm joint  Cramp and spasm  Abnormal posture     Problem List Patient Active Problem List   Diagnosis Date Noted  .  Light headedness 04/14/2019    Caprice Red  PT 04/27/2019, 10:50 AM  [facility] [address] [place], [place], [address] Phone: [phone]   Fax:  [phone]  Name: Eric Mcclain MRN: [id number] Date of Birth: Jan 09, 1991

## 2019-05-03 ENCOUNTER — Other Ambulatory Visit: Payer: Self-pay

## 2019-05-03 ENCOUNTER — Encounter: Payer: Self-pay | Admitting: Physical Therapy

## 2019-05-03 ENCOUNTER — Ambulatory Visit: Payer: No Typology Code available for payment source | Admitting: Physical Therapy

## 2019-05-03 DIAGNOSIS — R252 Cramp and spasm: Secondary | ICD-10-CM

## 2019-05-03 DIAGNOSIS — R293 Abnormal posture: Secondary | ICD-10-CM

## 2019-05-03 DIAGNOSIS — M25622 Stiffness of left elbow, not elsewhere classified: Secondary | ICD-10-CM

## 2019-05-03 DIAGNOSIS — M542 Cervicalgia: Secondary | ICD-10-CM | POA: Diagnosis not present

## 2019-05-03 DIAGNOSIS — M79632 Pain in left forearm: Secondary | ICD-10-CM

## 2019-05-03 NOTE — Therapy (Signed)
[facility] [address] [place], [place], [address] Phone: [phone]   Fax:  [phone]  Physical Therapy Treatment  Patient Details  Name: Eric Mcclain MRN: [id number] Date of Birth: 06/03/1991 Referring Provider (PT): Gwinda PasseMichelle Edwards NP   Encounter Date: 05/03/2019  PT End of Session - 05/03/19 1041    Visit Number  3    Number of Visits  12    Date for PT Re-Evaluation  06/02/19    Authorization Type  Med PAy    PT Start Time  1035    PT Stop Time  1120    PT Time Calculation (min)  45 min       Past Medical History:  Diagnosis Date  . Tendon laceration     Past Surgical History:  Procedure Laterality Date  . FINGER SURGERY Right 2006   fx ring finger    There were no vitals filed for this visit.  Subjective Assessment - 05/03/19 1045    Subjective  Not sure if last session was helpful. Arm was a little better until I lifted couch to get my son's toy. Now arm pain worse. Still pain with turning head. I am back to driving.     Currently in Pain?  Yes    Pain Score  6     Pain Location  Neck    Pain Orientation  Right    Pain Descriptors / Indicators  Burning;Aching    Aggravating Factors   turning head, lifting, sleep positions    Pain Relieving Factors  meds, rest                        [facility] Adult PT Treatment/Exercise - 05/03/19 0001      Neck Exercises: Seated   Postural Training  seated scap retract and chin tucks     Other Seated Exercise  gentle sie bend and rotation.       Neck Exercises: Supine   Neck Retraction  10 reps    Neck Retraction Limitations  towel roll      Manual Therapy   Soft tissue mobilization  Right cervical paraspinals upper trap     Passive ROM  gentle rotation and side bend    Manual Traction  neck - relief of neck burning with traction      Neck Exercises: Stretches   Levator Stretch  3 reps;20 seconds               PT Short Term Goals  - 04/20/19 1343      PT SHORT TERM GOAL #1   Title  He will be independent with initial Hep     Baseline  No program    Time  2    Period  Weeks    Status  New      PT SHORT TERM GOAL #2   Title  He will report Neck pain decr 20% or more     Baseline  variable neck pain to severe at times    Time  2    Period  Weeks    Status  New      PT SHORT TERM GOAL #3   Title  He will report LT arm pain decreased 25% or more    Baseline  varies to severe at times    Time  2    Period  Weeks    Status  New      PT SHORT  TERM GOAL #4   Title  He will demo understanding of good sitting posture    Baseline  forewrd ahead slump sitting    Time  2    Period  Weeks    Status  New        PT Long Term Goals - 04/20/19 1345      PT LONG TERM GOAL #1   Title  He will report pain as intermittant and decreased at least 75% in neck and LT arm    Baseline  pain improved 10-25%     Time  6    Period  Weeks    Status  New      PT LONG TERM GOAL #2   Title  He wqill be independent with all hEp issued    Baseline  independent with initial hEP    Time  6    Period  Weeks      PT LONG TERM GOAL #3   Title  He will have smooth full active ROM LT arm and  able to lift 20 pouds with no to min increased pain  that does not linger so he can return to normal work tasks    Baseline  unable to lift    Time  6    Period  Weeks    Status  New      PT LONG TERM GOAL #4   Title  He will demo full ROM neck  to return to full work tasks    Baseline  neck ROM improved but not normal    Time  6    Period  Weeks    Status  New            Plan - 05/03/19 1125    Clinical Impression Statement  ROM limited and painful with right rotation. Able to progress HEP to postural exercises. Pt needs cues to remain in upright posture. Palpable spasms in right parapsinals and upper traps. Soft tisuue worked softed the area. Manual traction to neck caused relief of burning in right neck. Pt may benefit from a  trail of traction. He reported feeling a little less pain with neck motion after manual.     PT Next Visit Plan  Manual and modalities. HEP, trial traction    PT Home Exercise Plan  Asked him to gently do ROm in comfortable ROM smooth and relaxed, scap squeeze, chin tuck supine and seated, levator stretch     Consulted and Agree with Plan of Care  Patient       Patient will benefit from skilled therapeutic intervention in order to improve the following deficits and impairments:  Pain, Increased muscle spasms, Decreased activity tolerance, Impaired UE functional use, Decreased range of motion  Visit Diagnosis: Cervicalgia  Pain in left forearm  Stiffness of left upper arm joint  Abnormal posture  Cramp and spasm     Problem List Patient Active Problem List   Diagnosis Date Noted  . Light headedness 04/14/2019    Dorene Ar, PTA 05/03/2019, 11:28 AM  [facility] [address] [place], [place], [address] Phone: [phone]   Fax:  [phone]  Name: Eric Mcclain MRN: [id number] Date of Birth: 01-22-1991

## 2019-05-04 ENCOUNTER — Ambulatory Visit: Payer: Self-pay | Attending: Primary Care | Admitting: Primary Care

## 2019-05-04 ENCOUNTER — Telehealth: Payer: Self-pay | Admitting: *Deleted

## 2019-05-04 NOTE — Telephone Encounter (Signed)
Medical Assistant left message on patient's home and cell voicemail. Voicemail states to give a call back to [place] with [facility] at [phone]. MA attempted to reach patient at 0:40 to begin his Tele visit. Patient did not answer and VM was left. MA reached out to patient at 10:01 with no answer and a VM was left.  Patient is aware of the 15 minute courtesy time and will need to reschedule if he returns a call after 10:05am.

## 2019-05-09 ENCOUNTER — Other Ambulatory Visit: Payer: Self-pay

## 2019-05-09 ENCOUNTER — Ambulatory Visit: Payer: No Typology Code available for payment source

## 2019-05-09 DIAGNOSIS — M542 Cervicalgia: Secondary | ICD-10-CM

## 2019-05-09 DIAGNOSIS — M25622 Stiffness of left elbow, not elsewhere classified: Secondary | ICD-10-CM

## 2019-05-09 DIAGNOSIS — M79632 Pain in left forearm: Secondary | ICD-10-CM

## 2019-05-09 NOTE — Therapy (Addendum)
[facility] [address] [place], [place], [address] Phone: [phone]   Fax:  [phone]  Physical Therapy Treatment  Patient Details  Name: Eric Mcclain MRN: [id number] Date of Birth: [birth year] Referring Provider (PT): Gwinda PasseMichelle Edwards NP   Encounter Date: 05/09/2019  PT End of Session - 05/09/19 0930    Visit Number  4    Number of Visits  12    Date for PT Re-Evaluation  06/02/19    PT Start Time  0930   late   PT Stop Time  1022    PT Time Calculation (min)  52 min    Activity Tolerance  Patient tolerated treatment well    Behavior During Therapy  [facility] for tasks assessed/performed       Past Medical History:  Diagnosis Date  . Tendon laceration     Past Surgical History:  Procedure Laterality Date  . FINGER SURGERY Right 2006   fx ring finger    There were no vitals filed for this visit.  Subjective Assessment - 05/09/19 0931    Subjective  My neck hurts over last 3 days.   Had to move items from home after lifting  items Arm is OK.    He feels something is wrong with his neck. He thinks he need an MRI or something to give more info about neck    Pain Score  8     Pain Location  Neck    Pain Type  Acute pain    Pain Onset  1 to 4 weeks ago    Pain Frequency  Constant    Aggravating Factors   turnig head , lifting . different sleep positions    Pain Relieving Factors  Meds,  heatr /cold    Pain Score  0    Pain Location  Arm                       [facility] Adult PT Treatment/Exercise - 05/09/19 0001      Modalities   Modalities  Traction      Moist Heat Therapy   Number Minutes Moist Heat  10 Minutes    Moist Heat Location  Cervical      Ultrasound   Ultrasound Location  Rt neck    Ultrasound Parameters  100% 1 MHz 1.6Wcm2    Ultrasound Goals  Pain      Traction   Type of Traction  Cervical    Min (lbs)  5    Max (lbs)  12    Hold Time  60    Rest Time  15    Time  10       Manual Therapy   Manual Therapy  Myofascial release    Soft tissue mobilization  Right cervical paraspinals upper trap     Myofascial Release  with light touch and pressure to soft tissue RT neck an dshoulder.     Passive ROM  gentle rotation and side bend    Manual Traction  neck      Neck Exercises: Stretches   Levator Stretch  3 reps;20 seconds               PT Short Term Goals - 05/09/19 1003      PT SHORT TERM GOAL #1   Title  He will be independent with initial Hep     Status  Achieved      PT SHORT TERM GOAL #2  Title  He will report Neck pain decr 20% or more     Status  On-going      PT SHORT TERM GOAL #3   Title  He will report LT arm pain decreased 25% or more    Status  Achieved      PT SHORT TERM GOAL #4   Title  He will demo understanding of good sitting posture    Baseline  he can correct    Status  Achieved        PT Long Term Goals - 04/20/19 1345      PT LONG TERM GOAL #1   Title  He will report pain as intermittant and decreased at least 75% in neck and LT arm    Baseline  pain improved 10-25%     Time  6    Period  Weeks    Status  New      PT LONG TERM GOAL #2   Title  He wqill be independent with all hEp issued    Baseline  independent with initial hEP    Time  6    Period  Weeks      PT LONG TERM GOAL #3   Title  He will have smooth full active ROM LT arm and  able to lift 20 pouds with no to min increased pain  that does not linger so he can return to normal work tasks    Baseline  unable to lift    Time  6    Period  Weeks    Status  New      PT LONG TERM GOAL #4   Title  He will demo full ROM neck  to return to full work tasks    Baseline  neck ROM improved but not normal    Time  6    Period  Weeks    Status  New            Plan - 05/09/19 0930    Clinical Impression Statement  Mr Godinho presents with increased painRT neck after moving items out of home.   ROM decreased . He feels something is wrong so it is  good he will see his MD tomorrow.    PT Treatment/Interventions  Dry needling;Manual techniques;Passive range of motion;Taping;Patient/family education;Therapeutic exercise;Iontophoresis 4mg /ml Dexamethasone;Traction;Moist Heat;Ultrasound;Electrical Stimulation    PT Next Visit Plan  Manual and modalities. HEP, trial traction assess response though started very low.    PT Home Exercise Plan  Asked him to gently do ROm in comfortable ROM smooth and relaxed, scap squeeze, chin tuck supine and seated, levator stretch     Consulted and Agree with Plan of Care  Patient       Patient will benefit from skilled therapeutic intervention in order to improve the following deficits and impairments:  Pain, Increased muscle spasms, Decreased activity tolerance, Impaired UE functional use, Decreased range of motion  Visit Diagnosis: 1. Pain in left forearm   2. Cervicalgia   3. Stiffness of left upper arm joint        Problem List Patient Active Problem List   Diagnosis Date Noted  . Light headedness 04/14/2019    Eric Mcclain  PT 05/09/2019, 11:15 AM  [facility] [address] [place], [place], [address] Phone: [phone]   Fax:  [phone]  Name: Eric Mcclain MRN: [id number] Date of Birth: [birth year]

## 2019-05-10 ENCOUNTER — Telehealth (HOSPITAL_BASED_OUTPATIENT_CLINIC_OR_DEPARTMENT_OTHER): Payer: Self-pay | Admitting: Physician Assistant

## 2019-05-10 ENCOUNTER — Other Ambulatory Visit: Payer: Self-pay

## 2019-05-10 DIAGNOSIS — R202 Paresthesia of skin: Secondary | ICD-10-CM

## 2019-05-10 DIAGNOSIS — S161XXD Strain of muscle, fascia and tendon at neck level, subsequent encounter: Secondary | ICD-10-CM

## 2019-05-10 MED ORDER — METHOCARBAMOL 500 MG PO TABS: 1000.0000 mg | ORAL_TABLET | Freq: Three times a day (TID) | ORAL | 0 refills | Status: DC

## 2019-05-10 MED ORDER — IBUPROFEN 600 MG PO TABS: 600.0000 mg | ORAL_TABLET | Freq: Three times a day (TID) | ORAL | 0 refills | Status: DC
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
## 2019-05-11 ENCOUNTER — Encounter: Payer: Self-pay | Admitting: Neurology

## 2019-05-11 ENCOUNTER — Other Ambulatory Visit: Payer: Self-pay

## 2019-05-11 ENCOUNTER — Emergency Department (HOSPITAL_COMMUNITY)
Admission: EM | Admit: 2019-05-11 | Discharge: 2019-05-11 | Disposition: A | Discharge status: Home / Self Care | Payer: Self-pay | Attending: Emergency Medicine | Admitting: Emergency Medicine

## 2019-05-11 ENCOUNTER — Encounter (HOSPITAL_COMMUNITY): Payer: Self-pay | Admitting: Emergency Medicine

## 2019-05-11 DIAGNOSIS — R030 Elevated blood-pressure reading, without diagnosis of hypertension: Secondary | ICD-10-CM

## 2019-05-11 DIAGNOSIS — W108XXA Fall (on) (from) other stairs and steps, initial encounter: Secondary | ICD-10-CM | POA: Insufficient documentation

## 2019-05-11 DIAGNOSIS — Y9389 Activity, other specified: Secondary | ICD-10-CM | POA: Insufficient documentation

## 2019-05-11 DIAGNOSIS — S81812A Laceration without foreign body, left lower leg, initial encounter: Secondary | ICD-10-CM

## 2019-05-11 DIAGNOSIS — Z23 Encounter for immunization: Secondary | ICD-10-CM | POA: Insufficient documentation

## 2019-05-11 DIAGNOSIS — Y999 Unspecified external cause status: Secondary | ICD-10-CM | POA: Insufficient documentation

## 2019-05-11 DIAGNOSIS — Y9289 Other specified places as the place of occurrence of the external cause: Secondary | ICD-10-CM | POA: Insufficient documentation

## 2019-05-11 MED ORDER — LIDOCAINE HCL 2 % IJ SOLN
10.0000 mL | Freq: Once | INTRAMUSCULAR | Status: AC
  Administered 2019-05-11: 200 mg
  Filled 2019-05-11: qty 20

## 2019-05-11 MED ORDER — LIDOCAINE-EPINEPHRINE-TETRACAINE (LET) SOLUTION
3.0000 mL | Freq: Once | NASAL | Status: AC
  Administered 2019-05-11: 3 mL via TOPICAL
  Filled 2019-05-11: qty 3

## 2019-05-11 MED ORDER — TETANUS-DIPHTH-ACELL PERTUSSIS 5-2.5-18.5 LF-MCG/0.5 IM SUSP
0.5000 mL | Freq: Once | INTRAMUSCULAR | Status: AC
  Administered 2019-05-11: 0.5 mL via INTRAMUSCULAR
  Filled 2019-05-11: qty 0.5

## 2019-05-11 NOTE — Discharge Instructions (Signed)
Please see the information and instructions below regarding your visit.  Your diagnoses today include:  1. Laceration of left lower extremity, initial encounter   .  Tests performed today include: Vital signs. See below for your results today.   Medications prescribed:   Take any prescribed medications only as directed.  Ibuprofen alternating with Tylenol for pain.   Home care instructions:  Follow any educational materials and wound care instructions contained in this packet.   Keep affected area above the level of your heart when possible to minimize swelling. Wash area gently twice a day with warm soapy water. Do not apply alcohol or hydrogen peroxide directly over a wound. Cover the area if it is draining or weeping. Keep the bandage in place for 24 hours and refrain from getting the wound wet for 24 hours. After that, you may get the area wet, but please ensure that you dry it completely afterwards.  Please refrain from soaking sutures for long periods of time, or swimming in chlorinated water   You may apply antibiotic ointment such as Bacitracin or Neosporin.  Follow-up instructions: Suture Removal: Return to the Emergency Department or see your primary care care doctor in 7 days for a recheck of your wound and removal of your sutures or staples.    Return instructions:  Return to the Emergency Department if you have: Fever Worsening pain Worsening swelling of the wound Pus draining from the wound Redness of the skin that moves away from the wound, especially if it streaks away from the affected area  Any other emergent concerns  Your vital signs today were: BP (!) 170/156 (BP Location: Left Arm)    Pulse 66    Temp 98.6 F (37 C) (Oral)    Resp 18    Ht 6\' 7"  (2.007 m)    Wt 104.3 kg    SpO2 100%    BMI 25.91 kg/m  If your blood pressure (BP) was elevated on multiple readings during this visit above 130 for the top number or above 80 for the bottom number, please have  this repeated by your primary care provider within one month. --------------  Thank you for allowing [place] to participate in your care today! It was a pleasure taking care of you.

## 2019-05-11 NOTE — ED Provider Notes (Signed)
[facility]-EMERGENCY DEPT Provider Note   CSN: [id number] Arrival date & time: 05/11/19  1948     History   Chief Complaint Chief Complaint  Patient presents with  . Extremity Laceration    HPI Eric Mcclain is a 28 y.o. male.     HPI  Patient is a 28 year old male past medical history of tendon laceration presenting for laceration to the left shin.  Patient reports that he was walking up the stairs and turned back to look at his son when he missed a step and fell onto his shin on the concrete steps causing an avulsion.  He denies any weakness or numbness distal to the injury.  Denies any loss of range of motion of the leg.  Bleeding controlled with compression.  Tetanus shot not up-to-date.  Past Medical History:  Diagnosis Date  . Tendon laceration     Patient Active Problem List   Diagnosis Date Noted  . Light headedness 04/14/2019    Past Surgical History:  Procedure Laterality Date  . FINGER SURGERY Right 2006   fx ring finger        Home Medications    Prior to Admission medications   Medication Sig Start Date End Date Taking? Authorizing Provider  ibuprofen (ADVIL) 600 MG tablet Take 1 tablet (600 mg total) by mouth 3 (three) times daily. X 1 week the prn pain 05/10/19   Anders SimmondsMcClung, Angela M, PA-C  methocarbamol (ROBAXIN) 500 MG tablet Take 2 tablets (1,000 mg total) by mouth 3 (three) times daily. X 1 week then prn muscle spasm 05/10/19   Anders SimmondsMcClung, Angela M, PA-C    Family History Family History  Problem Relation Age of Onset  . Diabetes Mother     Social History Social History   Tobacco Use  . Smoking status: Never Smoker  . Smokeless tobacco: Never Used  Substance Use Topics  . Alcohol use: Yes  . Drug use: No     Allergies   Patient has no known allergies.   Review of Systems Review of Systems  Musculoskeletal: Negative for arthralgias.  Skin: Positive for wound.  Neurological: Negative for weakness and numbness.      Physical Exam Updated Vital Signs BP (!) 170/156 (BP Location: Left Arm)   Pulse 66   Temp 98.6 F (37 C) (Oral)   Resp 18   Ht 6\' 7"  (2.007 m)   Wt 104.3 kg   SpO2 100%   BMI 25.91 kg/m   Physical Exam Vitals signs and nursing note reviewed.  Constitutional:      General: He is not in acute distress.    Appearance: He is well-developed. He is not diaphoretic.     Comments: Sitting comfortably in bed.  HENT:     Head: Normocephalic and atraumatic.  Eyes:     General:        Right eye: No discharge.        Left eye: No discharge.     Conjunctiva/sclera: Conjunctivae normal.     Comments: EOMs normal to gross examination.  Neck:     Musculoskeletal: Normal range of motion.  Cardiovascular:     Rate and Rhythm: Normal rate and regular rhythm.     Comments: Intact, 2+ DP pulse. Abdominal:     General: There is no distension.  Musculoskeletal: Normal range of motion.     Comments: 4 cm flap laceration just distal to the left knee. No tendon involvement.  Patient preserved flexion extension at  knee and dorsiflexion plantarflexion.  Full sensation distally.  Skin:    General: Skin is warm and dry.  Neurological:     Mental Status: He is alert.     Comments: Cranial nerves intact to gross observation. Patient moves extremities without difficulty.  Psychiatric:        Behavior: Behavior normal.        Thought Content: Thought content normal.        Judgment: Judgment normal.      ED Treatments / Results  Labs (all labs ordered are listed, but only abnormal results are displayed) Labs Reviewed - No data to display  EKG    Radiology No results found.  Procedures .Marland Kitchen.Laceration Repair  Date/Time: 05/11/2019 11:09 PM Performed by: Elisha PonderMurray, Jorell Agne B, PA-C Authorized by: Elisha PonderMurray, Augusto Deckman B, PA-C   Consent:    Consent obtained:  Verbal   Consent given by:  Patient   Risks discussed:  Pain, poor wound healing and poor cosmetic result Anesthesia (see MAR for  exact dosages):    Anesthesia method:  Topical application and local infiltration   Local anesthetic:  Lidocaine 2% w/o epi Laceration details:    Location:  Leg   Leg location:  L lower leg   Length (cm):  4 Repair type:    Repair type:  Simple Pre-procedure details:    Preparation:  Patient was prepped and draped in usual sterile fashion Exploration:    Wound exploration: wound explored through full range of motion     Wound extent: no muscle damage noted and no tendon damage noted     Contaminated: no   Treatment:    Area cleansed with:  Betadine and saline   Amount of cleaning:  Standard Skin repair:    Repair method:  Sutures   Suture size:  4-0   Suture material:  Nylon   Suture technique: 4 simple interrupted. 1 half buried horizontal mattress.  Approximation:    Approximation:  Close Post-procedure details:    Dressing:  Non-adherent dressing   Patient tolerance of procedure:  Tolerated well, no immediate complications   (including critical care time)  Medications Ordered in ED Medications  lidocaine-EPINEPHrine-tetracaine (LET) solution (3 mLs Topical Given 05/11/19 2047)  lidocaine (XYLOCAINE) 2 % (with pres) injection 200 mg (200 mg Infiltration Given 05/11/19 2047)  Tdap (BOOSTRIX) injection 0.5 mL (0.5 mLs Intramuscular Given 05/11/19 2047)     Initial Impression / Assessment and Plan / ED Course  I have reviewed the triage vital signs and the nursing notes.  Pertinent labs & imaging results that were available during my care of the patient were reviewed by me and considered in my medical decision making (see chart for details).        This is a well-appearing 28 year old male presented for laceration to the left leg.  Wound closed primarily with sutures.  There is no underlying tendon or muscle damage.  He is instructed to return or go to his primary care provider in 7 days for a suture removal.  Return precautions given for any increasing pain, erythema, or  drainage from the wound.  Patient is in understanding and agrees with plan of care.  Blood pressure elevated today.  Patient is informed.  Instructed follow-up with primary care. Improved during ED course.   Final Clinical Impressions(s) / ED Diagnoses   Final diagnoses:  Laceration of left lower extremity, initial encounter  Elevated blood pressure reading    ED Discharge Orders    None  Tamala Julian 05/11/19 2323    Lacretia Leigh, MD 05/15/19 1024

## 2019-05-11 NOTE — ED Triage Notes (Signed)
Patient here from home with complaints of fall today. Laceration to left lower leg. Bleeding controlled. Denies hitting head. Unknown last tetanus.

## 2019-05-12 ENCOUNTER — Other Ambulatory Visit: Payer: Self-pay

## 2019-05-12 ENCOUNTER — Telehealth (INDEPENDENT_AMBULATORY_CARE_PROVIDER_SITE_OTHER): Payer: Self-pay | Admitting: Neurology

## 2019-05-12 VITALS — Ht 79.0 in | Wt 230.0 lb

## 2019-05-12 DIAGNOSIS — M4722 Other spondylosis with radiculopathy, cervical region: Secondary | ICD-10-CM

## 2019-05-12 MED ORDER — GABAPENTIN 300 MG PO CAPS: ORAL_CAPSULE | ORAL | 1 refills | Status: DC

## 2019-05-12 MED ORDER — CYCLOBENZAPRINE HCL 5 MG PO TABS: 5.0000 mg | ORAL_TABLET | Freq: Every day | ORAL | 1 refills | Status: DC
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
## 2019-05-15 ENCOUNTER — Encounter: Payer: Self-pay | Admitting: Physical Therapy

## 2019-05-17 ENCOUNTER — Encounter: Payer: Self-pay | Admitting: Physical Therapy

## 2019-05-19 ENCOUNTER — Ambulatory Visit: Payer: No Typology Code available for payment source | Admitting: Physical Therapy

## 2019-05-19 ENCOUNTER — Telehealth: Payer: Self-pay | Admitting: Physical Therapy

## 2019-05-19 NOTE — Telephone Encounter (Signed)
Called patient regarding 930 appointment, he reported he was on his way, thought his time was 945. He was reminded that his next appointment is Wed 7/1 at 745.

## 2019-05-24 ENCOUNTER — Ambulatory Visit: Payer: No Typology Code available for payment source | Attending: Primary Care

## 2019-05-24 ENCOUNTER — Other Ambulatory Visit: Payer: Self-pay

## 2019-05-24 DIAGNOSIS — M542 Cervicalgia: Secondary | ICD-10-CM | POA: Diagnosis present

## 2019-05-24 DIAGNOSIS — M79632 Pain in left forearm: Secondary | ICD-10-CM | POA: Insufficient documentation

## 2019-05-24 DIAGNOSIS — R252 Cramp and spasm: Secondary | ICD-10-CM | POA: Diagnosis present

## 2019-05-24 DIAGNOSIS — R293 Abnormal posture: Secondary | ICD-10-CM | POA: Diagnosis present

## 2019-05-24 DIAGNOSIS — M25622 Stiffness of left elbow, not elsewhere classified: Secondary | ICD-10-CM | POA: Diagnosis present

## 2019-05-24 NOTE — Therapy (Signed)
[facility] [address] [place], [place], [address] Phone: [phone]   Fax:  [phone]  Physical Therapy Treatment  Patient Details  Name: Eric Mcclain MRN: [id number] Date of Birth: 06/21/1991 Referring Provider (PT): Michelle Edwards NP   Encounter Date: 05/24/2019  PT End of Session - 05/24/19 0752    Visit Number  5    Number of Visits  12    Date for PT Re-Evaluation  06/02/19    Authorization Type  Med PAy    PT Start Time  0752   late   PT Stop Time  0900    PT Time Calculation (min)  68 min    Activity Tolerance  Patient tolerated treatment well    Behavior During Therapy  WFL for tasks assessed/performed       Past Medical History:  Diagnosis Date  . Tendon laceration     Past Surgical History:  Procedure Laterality Date  . FINGER SURGERY Right 2006   fx ring finger    There were no vitals filed for this visit.  Subjective Assessment - 05/24/19 0753    Subjective  He reports some impreovement with neck movement.   Will see neurologist in person 06/27/19.    Pain Score  5     Pain Location  Neck    Pain Orientation  Right    Pain Onset  More than a month ago    Pain Frequency  Constant    Aggravating Factors   turning head    Pain Relieving Factors  heat    Pain Score  0         [facility] PT AssessmDahlia CliLexUpmc [id number] Cent[facility]DietricLev[facility] CenterDon [id number] Dahlia CliBraCentDahlia CliWaterbury Trumb[facility] Memoria16.1Holiday repre Max (lbs)  15    Hold Time  60    Rest Time  15    Time  10      Manual Therapy   Soft tissue mobilization  Right cervical paraspinals upper trap     Passive ROM  gentle rotation and side bend             PT Education - 05/24/19 0800    Education Details  HEP    Person(s) Educated  Patient    Methods  Explanation;Demonstration;Verbal cues;Handout    Comprehension  Verbalized understanding;Returned demonstration       PT  Short Term Goals - 05/09/19 1003      PT SHORT TERM GOAL #1   Title  He will be independent with initial Hep     Status  Achieved      PT SHORT TERM GOAL #2   Title  He will report Neck pain decr 20% or more     Status  On-going      PT SHORT TERM GOAL #3   Title  He will report LT arm pain decreased 25% or more    Status  Achieved      PT SHORT TERM GOAL #4   Title  He will demo understanding of good sitting posture    Baseline  he can correct    Status  Achieved        PT Long Term Goals - 04/20/19 1345      PT LONG TERM GOAL #1   Title  He will report pain as intermittant and decreased at least 75% in neck and LT arm    Baseline  pain improved 10-25%     Time  6    Period  Weeks    Status  New      PT LONG TERM GOAL #2   Title  He wqill be independent with all hEp issued    Baseline  independent with initial hEP    Time  6    Period  Weeks      PT LONG TERM GOAL #3   Title  He will have smooth full active ROM LT arm and  able to lift 20 pouds with no to min increased pain  that does not linger so he can return to normal work tasks    Baseline  unable to lift    Time  6    Period  Weeks    Status  New      PT LONG TERM GOAL #4   Title  He will demo full ROM neck  to return to full work tasks    Baseline  neck ROM improved but not normal     Time  6    Period  Weeks    Status  New            Plan - 05/24/19 0756    Clinical Impression Statement  Small ROM gains . Pain no better.  PROM normal in neck rotation but RT end range give significant incr pain on RT side. He may really need MRI to see if he has something causing impingement.    PT Treatment/Interventions  Dry needling;Manual techniques;Passive range of motion;Taping;Patient/family education;Therapeutic exercise;Iontophoresis 4mg /ml Dexamethasone;Traction;Moist Heat;Ultrasound;Electrical Stimulation    PT Next Visit Plan  Manual and modalities. HEP, trial traction assess response. .    PT Home Exercise Plan  Asked him to gently do ROm in comfortable ROM smooth and relaxed, scap squeeze, chin tuck supine and seated, levator stretch ,  band ER / hor abduction/ flexion /extension    Consulted and Agree with Plan of Care  Patient       Patient will benefit from skilled therapeutic intervention in order to improve the following deficits and impairments:  Pain, Increased muscle spasms, Decreased activity tolerance, Impaired UE functional use, Decreased range of motion  Visit Diagnosis: 1. Cervicalgia   2. Stiffness of left upper arm joint   3. Abnormal posture   4. Cramp and spasm        Problem List Patient Active Problem List   Diagnosis Date  Noted  Sunday Corn headedness 04/14/2019    Darrel Hoover  PT 05/24/2019, 8:30 AM  [facility] [address] [place], [place], [address] Phone: [phone]   Fax:  [phone]  Name: Eric Mcclain MRN: [id number] Date of Birth: Jan 21, 1991

## 2019-05-24 NOTE — Patient Instructions (Signed)
Yellow band unattached scapula exer 1-2x /day  10-20 reps from cabinet

## 2019-05-31 ENCOUNTER — Other Ambulatory Visit: Payer: Self-pay

## 2019-05-31 ENCOUNTER — Ambulatory Visit: Payer: No Typology Code available for payment source

## 2019-05-31 DIAGNOSIS — R293 Abnormal posture: Secondary | ICD-10-CM

## 2019-05-31 DIAGNOSIS — R252 Cramp and spasm: Secondary | ICD-10-CM

## 2019-05-31 DIAGNOSIS — M542 Cervicalgia: Secondary | ICD-10-CM | POA: Diagnosis not present

## 2019-05-31 NOTE — Therapy (Signed)
[place], [place], [address] Phone: [phone]   Fax:  [phone]  Physical Therapy Treatment  Patient Details  Name: Eric Mcclain MRN: [id number] Date of Birth: Jun 16, 1991 Referring Provider (PT): Juluis Mire NP   Encounter Date: 05/31/2019  PT End of Session - 05/31/19 0748    Visit Number  6    Number of Visits  12    Date for PT Re-Evaluation  06/02/19    Authorization Type  Med PAy    PT Start Time  0748    PT Stop Time  0843    PT Time Calculation (min)  55 min    Activity Tolerance  Patient tolerated treatment well    Behavior During Therapy  [facility] for tasks assessed/performed       Past Medical History:  Diagnosis Date  . Tendon laceration     Past Surgical History:  Procedure Laterality Date  . FINGER SURGERY Right 2006   fx ring finger    There were no vitals filed for this visit.  Subjective Assessment - 05/31/19 0829    Subjective  No pain to start today.  Have not returned to work or gym. Will wait to see MD before return to work    Currently in Pain?  No/denies         [facility] PT Assessment - 05/31/19 0001      AROM   Cervical - Right Side Bend  40    Cervical - Left Side Bend  42    Cervical - Right Rotation  65    Cervical - Left Rotation  65                   [facility] Adult PT Treatment/Exercise - 05/31/19 0001      Neck Exercises: Theraband   Other Theraband Exercises  rows , shoulder ext , abduct with ER below shoulder height.  x 15 red band      Neck Exercises: Seated   Cervical Rotation  Right;Left;5 reps    Shoulder Flexion  Right;Left;5 reps    Shoulder Flexion Limitations  yellow band      Moist Heat Therapy   Number Minutes Moist Heat  10 Minutes    Moist Heat Location  Cervical      Ultrasound   Ultrasound Location  RT neck    Ultrasound Parameters  100% 1.5 Wcm2, 100%    Ultrasound Goals  Pain      Traction   Type of Traction   Cervical    Min (lbs)  5    Max (lbs)  17    Hold Time  60    Rest Time  15    Time  10      Manual Therapy   Soft tissue mobilization  Right cervical paraspinals upper trap              PT Education - 05/31/19 0823    Education Details  HEP    Person(s) Educated  Patient    Methods  Explanation;Demonstration;Verbal cues;Handout    Comprehension  Returned demonstration;Verbalized understanding       PT Short Term Goals - 05/31/19 0825      PT SHORT TERM GOAL #1   Title  He will be independent with initial Hep     Status  Achieved      PT SHORT TERM GOAL #2   Title  He will report Neck pain decr 20%  or more     Status  Achieved      PT SHORT TERM GOAL #3   Title  He will report LT arm pain decreased 25% or more    Status  Achieved      PT SHORT TERM GOAL #4   Title  He will demo understanding of good sitting posture    Status  Achieved        PT Long Term Goals - 05/31/19 0825      PT LONG TERM GOAL #1   Title  He will report pain as intermittant and decreased at least 75% in neck and LT arm    Status  On-going      PT LONG TERM GOAL #2   Title  He wqill be independent with all hEp issued    Status  On-going      PT LONG TERM GOAL #3   Title  He will have smooth full active ROM LT arm and  able to lift 20 pouds with no to min increased pain  that does not linger so he can return to normal work tasks    Baseline  movement good and not lifting weight yet    Status  Partially Met      PT LONG TERM GOAL #4   Title  He will demo full ROM neck  to return to full work tasks    Baseline  65 degrees rotation bilateral today    Status  Partially Met            Plan - 05/31/19 0824    Clinical Impression Statement  Neck rotation is better and overall pain is better . He is still not lifting or back to work    PT Treatment/Interventions  Dry needling;Manual techniques;Passive range of motion;Taping;Patient/family education;Therapeutic  exercise;Iontophoresis '4mg'$ /ml Dexamethasone;Traction;Moist Heat;Ultrasound;Electrical Stimulation    PT Next Visit Plan  Manual and modalities. HEP, trial traction assess response. .    PT Home Exercise Plan  Asked him to gently do ROm in comfortable ROM smooth and relaxed, scap squeeze, chin tuck supine and seated, levator stretch ,  band ER / hor abduction/ flexion /extension    Consulted and Agree with Plan of Care  Patient       Patient will benefit from skilled therapeutic intervention in order to improve the following deficits and impairments:  Pain, Increased muscle spasms, Decreased activity tolerance, Impaired UE functional use, Decreased range of motion  Visit Diagnosis: 1. Abnormal posture   2. Cramp and spasm        Problem List Patient Active Problem List   Diagnosis Date Noted  . Light headedness 04/14/2019    Darrel Hoover  PT 05/31/2019, 8:37 AM  [facility] [address] [place], [place], [address] Phone: [phone]   Fax:  [phone]  Name: Eric Mcclain MRN: [id number] Date of Birth: 12/29/1990

## 2019-05-31 NOTE — Patient Instructions (Addendum)
Shoulder Extension: Sitting (Single Arm)    Face anchor, arm forward, thumb up. Pull arm down and back. Repeat __ times per set. Repeat with other arm. Do __ sets per session. Do __ sessions per week. Anchor Height: Over Head  http://tub.exer.us/283   Copyright  VHI. All rights reserved.  Shoulder Row: Sitting    Face anchor. Palms down, pull elbows back, squeezing shoulder blades together. Repeat __ times per set. Do __ sets per session. Do __ sessions per week. Anchor Height: Chest  http://tub.exer.us/285   Copyright  VHI. All rights reserved.  Do ABOVE WITH STANDING  AND DO er WITH ABDUCTION ALL X 10-15 REPS  1X/DAY

## 2019-06-01 ENCOUNTER — Ambulatory Visit: Payer: No Typology Code available for payment source

## 2019-06-06 ENCOUNTER — Ambulatory Visit: Payer: No Typology Code available for payment source

## 2019-06-08 ENCOUNTER — Ambulatory Visit: Payer: No Typology Code available for payment source

## 2019-06-08 ENCOUNTER — Telehealth: Payer: Self-pay | Admitting: Physical Therapy

## 2019-06-08 NOTE — Telephone Encounter (Signed)
Mr Veltre reports he called to report he was sick and would not be in this week. He is aware of next weeks appointment on Tuesday at 2:30.

## 2019-06-12 ENCOUNTER — Ambulatory Visit: Payer: No Typology Code available for payment source | Admitting: Physical Therapy

## 2019-06-16 ENCOUNTER — Encounter: Payer: Self-pay | Admitting: Physical Therapy

## 2019-06-16 ENCOUNTER — Ambulatory Visit: Payer: No Typology Code available for payment source | Admitting: Physical Therapy

## 2019-06-16 ENCOUNTER — Other Ambulatory Visit: Payer: Self-pay

## 2019-06-16 DIAGNOSIS — R252 Cramp and spasm: Secondary | ICD-10-CM

## 2019-06-16 DIAGNOSIS — M542 Cervicalgia: Secondary | ICD-10-CM | POA: Diagnosis not present

## 2019-06-16 DIAGNOSIS — M79632 Pain in left forearm: Secondary | ICD-10-CM

## 2019-06-16 DIAGNOSIS — M25622 Stiffness of left elbow, not elsewhere classified: Secondary | ICD-10-CM

## 2019-06-16 DIAGNOSIS — R293 Abnormal posture: Secondary | ICD-10-CM
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
## 2019-06-22 ENCOUNTER — Ambulatory Visit: Payer: No Typology Code available for payment source | Admitting: Physical Therapy

## 2019-06-22 ENCOUNTER — Ambulatory Visit (INDEPENDENT_AMBULATORY_CARE_PROVIDER_SITE_OTHER): Payer: Self-pay | Admitting: Neurology

## 2019-06-22 ENCOUNTER — Encounter: Payer: Self-pay | Admitting: Neurology

## 2019-06-22 ENCOUNTER — Other Ambulatory Visit: Payer: Self-pay

## 2019-06-22 ENCOUNTER — Encounter: Payer: Self-pay | Admitting: Physical Therapy

## 2019-06-22 VITALS — BP 118/60 | HR 74 | Ht 79.0 in | Wt 224.0 lb

## 2019-06-22 DIAGNOSIS — M542 Cervicalgia: Secondary | ICD-10-CM

## 2019-06-22 DIAGNOSIS — M5412 Radiculopathy, cervical region: Secondary | ICD-10-CM

## 2019-06-22 DIAGNOSIS — R293 Abnormal posture: Secondary | ICD-10-CM

## 2019-06-22 DIAGNOSIS — R252 Cramp and spasm: Secondary | ICD-10-CM

## 2019-06-22 DIAGNOSIS — M25622 Stiffness of left elbow, not elsewhere classified: Secondary | ICD-10-CM

## 2019-06-22 DIAGNOSIS — M79632 Pain in left forearm: Secondary | ICD-10-CM
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
## 2019-06-22 NOTE — Patient Instructions (Addendum)
MRI cervical spine without contrast  We have sent a referral to [facility] for your MRI and they will call you directly to schedule your appointment. They are located at [place]. If you need to contact them directly please call [phone].   Continue physical therapy and flexeril 5mg  at bedtime  We will call you with the results of your imaging

## 2019-06-22 NOTE — Therapy (Signed)
[facility] [address] [place], [place], [address] Phone: [phone]   Fax:  [phone]  Physical Therapy Treatment  Patient Details  Name: Eric Mcclain MRN: [id number] Date of Birth: 1991/11/15 Referring Provider (PT): Gwinda PasseMichelle Edwards NP   Encounter Date: 06/22/2019  PT End of Session - 06/22/19 1059    Visit Number  8    Number of Visits  15    Date for PT Re-Evaluation  07/14/19    Authorization Type  Med PAy    PT Start Time  1101    PT Stop Time  1144    PT Time Calculation (min)  43 min    Activity Tolerance  Patient tolerated treatment well    Behavior During Therapy  [facility] for tasks assessed/performed       Past Medical History:  Diagnosis Date  . Tendon laceration     Past Surgical History:  Procedure Laterality Date  . FINGER SURGERY Right 2006   fx ring finger    There were no vitals filed for this visit.  Subjective Assessment - 06/22/19 1102    Subjective  i am still having some soreness inthe neck and shoulder, I see the neurologist today"    Patient Stated Goals  Decrease pain    Currently in Pain?  Yes    Pain Score  5     Pain Location  Neck    Pain Orientation  Right    Pain Descriptors / Indicators  Aching    Pain Type  Chronic pain    Pain Onset  More than a month ago    Pain Frequency  Intermittent         [facility] PT Assessment - 06/22/19 0001      Observation/Other Assessments   Focus on Therapeutic Outcomes (FOTO)   56% limited                   [facility] Adult PT Treatment/Exercise - 06/22/19 0001      Neck Exercises: Seated   Neck Retraction  10 reps;5 secs   demonstration for proper form   Cervical Rotation  10 reps   upper cervical rotatoinusing 4 fingers   Money  10 reps   with yellow theraband x 2sets   Other Seated Exercise  seated first rib mobs    Other Seated Exercise  seated thoracic roation 2 x 10, seated thoracic extension 1 x 10 with arms crossed       Manual Therapy   Manual Therapy  Other (comment)    Manual therapy comments  skilled palpation and monitoring during TPDN    Joint Mobilization  lateral cervical glides grade III, R first rib mobs    Soft tissue mobilization  bil upper trap, suboccipitals, Rt scalenes    Other Manual Therapy  R shoulder upper trap inhibition taping      Neck Exercises: Stretches   Upper Trapezius Stretch  2 reps;30 seconds       Trigger Point Dry Needling - 06/22/19 0001    Consent Given?  Yes    Education Handout Provided  Previously provided    Muscles Treated Head and Neck  Scalenes;Upper trapezius;Cervical multifidi    Upper Trapezius Response  Twitch reponse elicited;Palpable increased muscle length   R side only   Scalenes Response  Twitch reponse elicited;Palpable increased muscle length   R side only   Cervical multifidi Response  Twitch reponse elicited;Palpable increased muscle length   R side  only          PT Education - 06/22/19 1147    Education Details  updated HEP for rib mobs. pain education    Person(s) Educated  Patient    Methods  Explanation;Verbal cues;Handout    Comprehension  Verbalized understanding;Verbal cues required       PT Short Term Goals - 05/31/19 0825      PT SHORT TERM GOAL #1   Title  He will be independent with initial Hep     Status  Achieved      PT SHORT TERM GOAL #2   Title  He will report Neck pain decr 20% or more     Status  Achieved      PT SHORT TERM GOAL #3   Title  He will report LT arm pain decreased 25% or more    Status  Achieved      PT SHORT TERM GOAL #4   Title  He will demo understanding of good sitting posture    Status  Achieved        PT Long Term Goals - 06/16/19 1046      PT LONG TERM GOAL #1   Title  He will report pain as intermittant and decreased at least 75% in neck and LT arm    Baseline  still has a lot of pain    Status  On-going      PT LONG TERM GOAL #2   Title  He will be independent with all  hEp issued    Baseline  requires progression    Status  On-going      PT LONG TERM GOAL #3   Title  He will have smooth full active ROM LT arm and  able to lift 20 pouds with no to min increased pain  that does not linger so he can return to normal work tasks    Baseline  improved motion after needling, discomfort lifting arms, unable to tolerate 25lb      PT LONG TERM GOAL #4   Title  He will demo full ROM neck  to return to full work tasks    Baseline  see flowsheet            Plan - 06/22/19 1145    Clinical Impression Statement  pt reports continued pain in the R side of the neck into the L shoulder with report of referrla symptoms in to the R hand. He demonstrates increased cervical ROM compared to previous notes. continued TPDN on R posterior cervical structures. continued working on cervical and thoracic mobility and shoulder strengthening. pt reported continued soreness at end of session stating it was likely do to DN, but pt declined modalities.       Patient will benefit from skilled therapeutic intervention in order to improve the following deficits and impairments:     Visit Diagnosis: 1. Abnormal posture   2. Cramp and spasm   3. Cervicalgia   4. Stiffness of left upper arm joint   5. Pain in left forearm        Problem List Patient Active Problem List   Diagnosis Date Noted  . Light headedness 04/14/2019   Starr Lake PT, DPT, LAT, ATC  06/22/19  11:48 AM      [facility] [address] [place], [place], [address] Phone: [phone]   Fax:  [phone]  Name: Eric Mcclain MRN: [id number] Date of Birth: [birth year]

## 2019-06-27 ENCOUNTER — Ambulatory Visit: Payer: Self-pay | Admitting: Neurology

## 2019-07-04 ENCOUNTER — Other Ambulatory Visit: Payer: Self-pay

## 2019-07-04 ENCOUNTER — Telehealth (INDEPENDENT_AMBULATORY_CARE_PROVIDER_SITE_OTHER): Payer: Self-pay | Admitting: Primary Care

## 2019-07-04 DIAGNOSIS — M545 Low back pain, unspecified: Secondary | ICD-10-CM

## 2019-07-04 DIAGNOSIS — M542 Cervicalgia: Secondary | ICD-10-CM

## 2019-07-04 DIAGNOSIS — Z7689 Persons encountering health services in other specified circumstances: Secondary | ICD-10-CM

## 2019-07-07 ENCOUNTER — Ambulatory Visit: Payer: No Typology Code available for payment source | Attending: Primary Care | Admitting: Physical Therapy

## 2019-07-07 ENCOUNTER — Other Ambulatory Visit: Payer: Self-pay

## 2019-07-07 DIAGNOSIS — M79632 Pain in left forearm: Secondary | ICD-10-CM | POA: Diagnosis present

## 2019-07-07 DIAGNOSIS — M25622 Stiffness of left elbow, not elsewhere classified: Secondary | ICD-10-CM | POA: Insufficient documentation

## 2019-07-07 DIAGNOSIS — M542 Cervicalgia: Secondary | ICD-10-CM | POA: Diagnosis present

## 2019-07-07 DIAGNOSIS — R252 Cramp and spasm: Secondary | ICD-10-CM

## 2019-07-07 DIAGNOSIS — R293 Abnormal posture: Secondary | ICD-10-CM | POA: Insufficient documentation

## 2019-07-07 NOTE — Therapy (Signed)
[place], [place], [address] Phone: [phone]   Fax:  [phone]  Physical Therapy Treatment  Patient Details  Name: Eric Mcclain MRN: [id number] Date of Birth: 03/31/1991 Referring Provider (PT): Juluis Mire NP   Encounter Date: 07/07/2019  PT End of Session - 07/07/19 1008    Visit Number  9    Number of Visits  15    Date for PT Re-Evaluation  07/14/19    Authorization Type  Med PAy    PT Start Time  1008   pt is 8 min late   PT Stop Time  1058    PT Time Calculation (min)  50 min    Activity Tolerance  Patient tolerated treatment well    Behavior During Therapy  [facility] for tasks assessed/performed       Past Medical History:  Diagnosis Date  . Tendon laceration     Past Surgical History:  Procedure Laterality Date  . FINGER SURGERY Right 2006   fx ring finger    There were no vitals filed for this visit.  Subjective Assessment - 07/07/19 1009    Subjective  "i am doing better today"    Currently in Pain?  Yes    Pain Score  6     Pain Orientation  Right    Pain Type  Chronic pain    Pain Onset  More than a month ago    Pain Frequency  Intermittent         [facility] PT Assessment - 07/07/19 0001      Assessment   Medical Diagnosis  cervicalgia    Referring Provider (PT)  Juluis Mire NP                   [facility] Adult PT Treatment/Exercise - 07/07/19 0001      Self-Care   Self-Care  Other Self-Care Comments    Other Self-Care Comments   MTPR techniques and tools that can be used to assist      Exercises   Exercises  Shoulder      Neck Exercises: Machines for Strengthening   UBE (Upper Arm Bike)  L2 x 5 min   changing directionat 2:30 sec     Shoulder Exercises: Standing   Extension  Strengthening;Both;15 reps;Theraband    Theraband Level (Shoulder Extension)  Level 3 (Green)    Row  Strengthening;10 reps;Theraband    Theraband Level (Shoulder  Row)  Level 3 (Green)      Traction   Type of Traction  Cervical    Min (lbs)  15    Max (lbs)  22    Hold Time  60    Rest Time  15    Time  10      Manual Therapy   Manual therapy comments  MTPR along the R upper trap/ scalenes      Neck Exercises: Stretches   Upper Trapezius Stretch  2 reps;30 seconds    Levator Stretch  2 reps;30 seconds;Left    Other Neck Stretches  scalene stretch Rt               PT Short Term Goals - 05/31/19 0825      PT SHORT TERM GOAL #1   Title  He will be independent with initial Hep     Status  Achieved      PT SHORT TERM GOAL #2   Title  He will report Neck pain  decr 20% or more     Status  Achieved      PT SHORT TERM GOAL #3   Title  He will report LT arm pain decreased 25% or more    Status  Achieved      PT SHORT TERM GOAL #4   Title  He will demo understanding of good sitting posture    Status  Achieved        PT Long Term Goals - 06/16/19 1046      PT LONG TERM GOAL #1   Title  He will report pain as intermittant and decreased at least 75% in neck and LT arm    Baseline  still has a lot of pain    Status  On-going      PT LONG TERM GOAL #2   Title  He will be independent with all hEp issued    Baseline  requires progression    Status  On-going      PT LONG TERM GOAL #3   Title  He will have smooth full active ROM LT arm and  able to lift 20 pouds with no to min increased pain  that does not linger so he can return to normal work tasks    Baseline  improved motion after needling, discomfort lifting arms, unable to tolerate 25lb      PT LONG TERM GOAL #4   Title  He will demo full ROM neck  to return to full work tasks    Baseline  see flowsheet            Plan - 07/07/19 1008    Clinical Impression Statement  pt notes improvement in pain since the last session, states he is doing more at home. Focused on self MTPR techniques and stretching which he did well with. worked on shoulder strengthening to  maximize stability which he did well, upgraded resistance to green theraband. trialed cervical traction with increaesd pull to assist with relief.    PT Next Visit Plan  lifting, DN PRN, gross stretching, UE CKC exercises    PT Home Exercise Plan  Asked him to gently do ROm in comfortable ROM smooth and relaxed, scap squeeze, chin tuck supine and seated, levator stretch ,  band ER / hor abduction/ flexion /extension    Consulted and Agree with Plan of Care  Patient       Patient will benefit from skilled therapeutic intervention in order to improve the following deficits and impairments:  Pain, Increased muscle spasms, Decreased activity tolerance, Impaired UE functional use, Decreased range of motion  Visit Diagnosis: 1. Abnormal posture   2. Cramp and spasm   3. Cervicalgia        Problem List Patient Active Problem List   Diagnosis Date Noted  . Light headedness 04/14/2019   Lulu RidingKristoffer Markese Bloxham PT, DPT, LAT, ATC  07/07/19  10:45 AM      [facility] [address] [place], [place], [address] Phone: [phone]   Fax:  [phone]  Name: Freddi CheJoshua E Ishida MRN: [id number] Date of Birth: 05/11/1991
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
## 2019-07-10 ENCOUNTER — Other Ambulatory Visit: Payer: Self-pay

## 2019-07-10 ENCOUNTER — Ambulatory Visit: Payer: No Typology Code available for payment source | Admitting: Physical Therapy

## 2019-07-10 ENCOUNTER — Encounter: Payer: Self-pay | Admitting: Physical Therapy

## 2019-07-10 DIAGNOSIS — M25622 Stiffness of left elbow, not elsewhere classified: Secondary | ICD-10-CM

## 2019-07-10 DIAGNOSIS — M79632 Pain in left forearm: Secondary | ICD-10-CM

## 2019-07-10 DIAGNOSIS — R293 Abnormal posture: Secondary | ICD-10-CM | POA: Diagnosis not present

## 2019-07-10 DIAGNOSIS — M542 Cervicalgia: Secondary | ICD-10-CM

## 2019-07-10 DIAGNOSIS — R252 Cramp and spasm: Secondary | ICD-10-CM

## 2019-07-10 NOTE — Therapy (Signed)
[facility] [address] [place], [place], [address] Phone: [phone]   Fax:  [phone]  Physical Therapy Treatment  Patient Details  Name: Freddi CheJoshua E Michelotti MRN: [id number] Date of Birth: [birth year] Referring Provider (PT): Gwinda PasseMichelle Edwards NP   Encounter Date: 07/10/2019  PT End of Session - 07/10/19 1410    Visit Number  10    Number of Visits  15    Date for PT Re-Evaluation  07/14/19    Authorization Type  Med PAy    PT Start Time  1406    PT Stop Time  1445    PT Time Calculation (min)  39 min    Activity Tolerance  Patient tolerated treatment well    Behavior During Therapy  [facility] for tasks assessed/performed       Past Medical History:  Diagnosis Date  . Tendon laceration     Past Surgical History:  Procedure Laterality Date  . FINGER SURGERY Right 2006   fx ring finger    There were no vitals filed for this visit.  Subjective Assessment - 07/10/19 1409    Subjective  started sleeping with neck pillow, trying to move my neck more.    Patient Stated Goals  Decrease pain    Currently in Pain?  No/denies         [facility] Assessment - 07/10/19 0001      AROM   Cervical - Right Rotation  50   70 at end of session   Cervical - Left Rotation  70                   [facility] Adult PT Treatment/Exercise - 07/10/19 0001      Neck Exercises: Prone   Plank  qped rocking    Other Prone Exercise  qped retraction+rotation      Shoulder Exercises: Prone   Retraction Limitations  arm hanging from table    Flexion Limitations  retraction+flexion 4lb    Extension Limitations  prone retraction+extension 4lb    Other Prone Exercises  prone retraction +row 4lbv      Shoulder Exercises: Standing   Other Standing Exercises  lifting from low postiion of FM      Manual Therapy   Joint Mobilization  C4-5-6 Lt to Rt; prone thoracic PA, T1-3 Rt unilateral PA    Soft tissue mobilization  Rt upper trap &  cervical paraspinals    Manual Traction  cervical               PT Short Term Goals - 05/31/19 0825      PT SHORT TERM GOAL #1   Title  He will be independent with initial Hep     Status  Achieved      PT SHORT TERM GOAL #2   Title  He will report Neck pain decr 20% or more     Status  Achieved      PT SHORT TERM GOAL #3   Title  He will report LT arm pain decreased 25% or more    Status  Achieved      PT SHORT TERM GOAL #4   Title  He will demo understanding of good sitting posture    Status  Achieved        PT Long Term Goals - 06/16/19 1046      PT LONG TERM GOAL #1   Title  He will report pain as intermittant and decreased at least 75%  in neck and LT arm    Baseline  still has a lot of pain    Status  On-going      PT LONG TERM GOAL #2   Title  He will be independent with all hEp issued    Baseline  requires progression    Status  On-going      PT LONG TERM GOAL #3   Title  He will have smooth full active ROM LT arm and  able to lift 20 pouds with no to min increased pain  that does not linger so he can return to normal work tasks    Baseline  improved motion after needling, discomfort lifting arms, unable to tolerate 25lb      PT LONG TERM GOAL #4   Title  He will demo full ROM neck  to return to full work tasks    Baseline  see flowsheet            Plan - 07/10/19 1445    Clinical Impression Statement  Limited joint mobility C4 Rt to Lt addressed wiht joint mobs & trigger points created concordant pain down UE- released with ischemic release. Significant improvement in ROM noted at the end of the session with stretching sensation at end range.Poor core activation in lifting creating increased use of upper traps- educated on core activation today which was difficult and asked pt to practice at home.    PT Treatment/Interventions  Dry needling;Manual techniques;Passive range of motion;Taping;Patient/family education;Therapeutic exercise;Iontophoresis  4mg /ml Dexamethasone;Traction;Moist Heat;Ultrasound;Electrical Stimulation    PT Next Visit Plan  core strengthening for posture, d/c vs ERO?    PT Home Exercise Plan  Asked him to gently do ROm in comfortable ROM smooth and relaxed, scap squeeze, chin tuck supine and seated, levator stretch ,  band ER / hor abduction/ flexion /extension    Consulted and Agree with Plan of Care  Patient       Patient will benefit from skilled therapeutic intervention in order to improve the following deficits and impairments:  Pain, Increased muscle spasms, Decreased activity tolerance, Impaired UE functional use, Decreased range of motion  Visit Diagnosis: 1. Abnormal posture   2. Cramp and spasm   3. Cervicalgia   4. Stiffness of left upper arm joint   5. Pain in left forearm        Problem List Patient Active Problem List   Diagnosis Date Noted  . Light headedness 04/14/2019    Juleah Paradise C. Brylyn Novakovich PT, DPT 07/10/19 2:54 PM   [facility] [address] [place], [place], [address] Phone: [phone]   Fax:  [phone]  Name: ANTONIOUS OMAHONEY MRN: [id number] Date of Birth: 1991/06/07

## 2019-07-13 ENCOUNTER — Ambulatory Visit: Payer: No Typology Code available for payment source | Admitting: Physical Therapy

## 2019-07-21 DIAGNOSIS — Z0289 Encounter for other administrative examinations: Secondary | ICD-10-CM

## 2019-07-25 DIAGNOSIS — Z0289 Encounter for other administrative examinations: Secondary | ICD-10-CM

## 2019-07-26 ENCOUNTER — Ambulatory Visit: Payer: No Typology Code available for payment source | Attending: Primary Care | Admitting: Physical Therapy

## 2019-07-26 ENCOUNTER — Encounter: Payer: Self-pay | Admitting: Physical Therapy

## 2019-07-26 ENCOUNTER — Other Ambulatory Visit: Payer: Self-pay

## 2019-07-26 DIAGNOSIS — R293 Abnormal posture: Secondary | ICD-10-CM

## 2019-07-26 DIAGNOSIS — M25622 Stiffness of left elbow, not elsewhere classified: Secondary | ICD-10-CM

## 2019-07-26 DIAGNOSIS — R252 Cramp and spasm: Secondary | ICD-10-CM | POA: Insufficient documentation

## 2019-07-26 DIAGNOSIS — M542 Cervicalgia: Secondary | ICD-10-CM | POA: Diagnosis present

## 2019-07-26 DIAGNOSIS — M79632 Pain in left forearm: Secondary | ICD-10-CM | POA: Insufficient documentation

## 2019-07-26 NOTE — Therapy (Signed)
[place], [place], [address] Phone: [phone]   Fax:  [phone]  Physical Therapy Treatment / Discharge  Patient Details  Name: Eric Mcclain MRN: [id number] Date of Birth: 04-14-1991 Referring Provider (PT): Juluis Mire NP   Encounter Date: 07/26/2019  PT End of Session - 07/26/19 3474    Visit Number  11    Number of Visits  15    Date for PT Re-Evaluation  07/26/19    PT Start Time  2595    PT Stop Time  1709    PT Time Calculation (min)  38 min    Activity Tolerance  Patient tolerated treatment well    Behavior During Therapy  [facility] for tasks assessed/performed       Past Medical History:  Diagnosis Date  . Tendon laceration     Past Surgical History:  Procedure Laterality Date  . FINGER SURGERY Right 2006   fx ring finger    There were no vitals filed for this visit.  Subjective Assessment - 07/26/19 1632    Subjective  " I am doing pretty good, no pain. I returned to the gym and I felt some soreness at the end"    Patient Stated Goals  Decrease pain    Currently in Pain?  No/denies    Pain Orientation  Right    Pain Descriptors / Indicators  Aching    Pain Type  Chronic pain    Pain Onset  More than a month ago         [facility] PT Assessment - 07/26/19 0001      Assessment   Medical Diagnosis  cervicalgia    Referring Provider (PT)  Juluis Mire NP      AROM   Cervical Flexion  52    Cervical Extension  60    Cervical - Right Side Bend  58    Cervical - Left Side Bend  48    Cervical - Right Rotation  71    Cervical - Left Rotation  72                   [facility] Adult PT Treatment/Exercise - 07/26/19 0001      Exercises   Exercises  Lumbar      Lumbar Exercises: Supine   Dead Bug  20 reps   x 2   Dead Bug Limitations  2 x 20 with alternating L/R reaching for obliques      Shoulder Exercises: ROM/Strengthening   Other ROM/Strengthening  Exercises  bicep curl 21's with 10# bil    cues for proper form and keep core tight     Neck Exercises: Stretches   Upper Trapezius Stretch  2 reps;30 seconds             PT Education - 07/26/19 1715    Education Details  reviewed previously provided HEP and discussed appropriate strengthening progressing focusig on machines intially to promote strength and reduce compensation before return to free weights, benefits of core strengthening.    Person(s) Educated  Patient    Methods  Explanation;Verbal cues    Comprehension  Verbalized understanding;Verbal cues required       PT Short Term Goals - 05/31/19 0825      PT SHORT TERM GOAL #1   Title  He will be independent with initial Hep     Status  Achieved      PT SHORT TERM GOAL #2  Title  He will report Neck pain decr 20% or more     Status  Achieved      PT SHORT TERM GOAL #3   Title  He will report LT arm pain decreased 25% or more    Status  Achieved      PT SHORT TERM GOAL #4   Title  He will demo understanding of good sitting posture    Status  Achieved        PT Long Term Goals - 07/26/19 1638      PT LONG TERM GOAL #1   Title  He will report pain as intermittant and decreased at least 75% in neck and LT arm    Period  Weeks    Status  Achieved      PT LONG TERM GOAL #2   Title  He will be independent with all hEp issued      PT LONG TERM GOAL #3   Title  He will have smooth full active ROM LT arm and  able to lift 20 pouds with no to min increased pain  that does not linger so he can return to normal work tasks    Period  Weeks    Status  Achieved      PT LONG TERM GOAL #4   Title  He will demo full ROM neck  to return to full work tasks    Period  Weeks    Status  Achieved            Plan - 07/26/19 1712    Clinical Impression Statement  Eric Mcclain has made great progress with physical therapy increased cervical ROM and additionally reports no pain today. he notes he has returned to the gym but  did report some soreness due to trying to do too much. discussed at length progression of exercises. He met all goals today and is able to maintain his current level of function and will be discharged from PT today.    PT Treatment/Interventions  Dry needling;Manual techniques;Passive range of motion;Taping;Patient/family education;Therapeutic exercise;Iontophoresis 28m/ml Dexamethasone;Traction;Moist Heat;Ultrasound;Electrical Stimulation    PT Next Visit Plan  d/C    PT Home Exercise Plan  Asked him to gently do ROm in comfortable ROM smooth and relaxed, scap squeeze, chin tuck supine and seated, levator stretch ,  band ER / hor abduction/ flexion /extension, dead bug    Consulted and Agree with Plan of Care  Patient       Patient will benefit from skilled therapeutic intervention in order to improve the following deficits and impairments:  Pain, Increased muscle spasms, Decreased activity tolerance, Impaired UE functional use, Decreased range of motion  Visit Diagnosis: Abnormal posture  Cramp and spasm  Cervicalgia  Stiffness of left upper arm joint  Pain in left forearm     Problem List Patient Active Problem List   Diagnosis Date Noted  . Light headedness 04/14/2019   KStarr LakePT, DPT, LAT, ATC  07/26/19  5:20 PM      [facility] [address]Phone: [phone]  Fax:  [phone] Name: Eric THEISENMRN: [id number] of Birth: [birth year]        PHYSICAL THERAPY DISCHARGE SUMMARY  Visits from Start of Care: 11  Current functional level related to goals / functional outcomes: See goals, FOTO 32% limited   Remaining deficits: Intermittent soreness in the neck that improves with strengthening and activity.  Education / Equipment: HEP, Theraband, posture and lifting mechanics  Plan: Patient agrees to discharge.  Patient goals were met. Patient is being  discharged due to not returning since the last visit.  ?????         Fausto Sampedro PT, DPT, LAT, ATC  07/26/19  5:22 PM

## 2019-07-29 ENCOUNTER — Other Ambulatory Visit: Payer: Self-pay

## 2019-07-31 ENCOUNTER — Emergency Department (HOSPITAL_COMMUNITY)
Admission: EM | Admit: 2019-07-31 | Discharge: 2019-07-31 | Disposition: A | Discharge status: Home / Self Care | Payer: Self-pay | Attending: Emergency Medicine | Admitting: Emergency Medicine

## 2019-07-31 ENCOUNTER — Encounter (HOSPITAL_COMMUNITY): Payer: Self-pay | Admitting: Emergency Medicine

## 2019-07-31 ENCOUNTER — Other Ambulatory Visit: Payer: Self-pay

## 2019-07-31 DIAGNOSIS — Y92310 Basketball court as the place of occurrence of the external cause: Secondary | ICD-10-CM | POA: Insufficient documentation

## 2019-07-31 DIAGNOSIS — Y999 Unspecified external cause status: Secondary | ICD-10-CM | POA: Insufficient documentation

## 2019-07-31 DIAGNOSIS — S86002A Unspecified injury of left Achilles tendon, initial encounter: Secondary | ICD-10-CM

## 2019-07-31 DIAGNOSIS — S86092A Other specified injury of left Achilles tendon, initial encounter: Secondary | ICD-10-CM | POA: Insufficient documentation

## 2019-07-31 DIAGNOSIS — X501XXA Overexertion from prolonged static or awkward postures, initial encounter: Secondary | ICD-10-CM | POA: Insufficient documentation

## 2019-07-31 DIAGNOSIS — Y9367 Activity, basketball: Secondary | ICD-10-CM | POA: Insufficient documentation

## 2019-07-31 MED ORDER — IBUPROFEN 200 MG PO TABS
400.0000 mg | ORAL_TABLET | Freq: Once | ORAL | Status: AC
  Administered 2019-07-31: 400 mg via ORAL
  Filled 2019-07-31: qty 2

## 2019-07-31 MED ORDER — ACETAMINOPHEN 500 MG PO TABS
1000.0000 mg | ORAL_TABLET | Freq: Once | ORAL | Status: AC
  Administered 2019-07-31: 1000 mg via ORAL
  Filled 2019-07-31: qty 2

## 2019-07-31 NOTE — ED Provider Notes (Signed)
[facility]-EMERGENCY DEPT Provider Note   CSN: [id number] Arrival date & time: 07/31/19  1030     History   Chief Complaint Chief Complaint  Patient presents with  . Foot Pain    HPI Eric Mcclain is a 28 y.o. male.     Patient indicates was playing basketball this AM, went to push off hard on the left foot to go grab ball, and felt 'pop' and pain to left achilles area. Pain acute onset, constant, moderate, non radiating. Was able to walk off court post incident. No meds pta. Denies rolling or twisting injury to ankle. No foot pain. Skin intact. No numbness to foot/toes. Denies other pain or injury.   The history is provided by the patient.  Foot Pain    Past Medical History:  Diagnosis Date  . Tendon laceration     Patient Active Problem List   Diagnosis Date Noted  . Light headedness 04/14/2019    Past Surgical History:  Procedure Laterality Date  . FINGER SURGERY Right 2006   fx ring finger        Home Medications    Prior to Admission medications   Medication Sig Start Date End Date Taking? Authorizing Provider  cyclobenzaprine (FLEXERIL) 5 MG tablet Take 1 tablet (5 mg total) by mouth at bedtime. 05/12/19   Patel, Roxana Hiresonika K, DO  ibuprofen (ADVIL) 600 MG tablet Take 1 tablet (600 mg total) by mouth 3 (three) times daily. X 1 week the prn pain 05/10/19   Anders SimmondsMcClung, Angela M, PA-C    Family History Family History  Problem Relation Age of Onset  . Diabetes Mother     Social History Social History   Tobacco Use  . Smoking status: Never Smoker  . Smokeless tobacco: Never Used  Substance Use Topics  . Alcohol use: Yes  . Drug use: No     Allergies   Patient has no known allergies.   Review of Systems Review of Systems  Constitutional: Negative for fever.  Cardiovascular: Negative for leg swelling.  Musculoskeletal:       Left achilles area pain  Skin: Negative for wound.  Neurological: Negative for weakness and numbness.      Physical Exam Updated Vital Signs BP (!) 129/51 (BP Location: Left Arm)   Pulse 100   Temp 99 F (37.2 C) (Oral)   Resp 18   Ht 2.007 m (6\' 7" )   Wt 104.3 kg   SpO2 99%   BMI 25.91 kg/m   Physical Exam Vitals signs and nursing note reviewed.  Constitutional:      Appearance: Normal appearance. He is well-developed.  HENT:     Head: Atraumatic.     Nose: Nose normal.     Mouth/Throat:     Mouth: Mucous membranes are moist.     Pharynx: Oropharynx is clear.  Eyes:     General: No scleral icterus.    Conjunctiva/sclera: Conjunctivae normal.  Neck:     Musculoskeletal: Neck supple.     Trachea: No tracheal deviation.  Cardiovascular:     Rate and Rhythm: Normal rate.     Pulses: Normal pulses.  Pulmonary:     Effort: Pulmonary effort is normal. No accessory muscle usage or respiratory distress.  Abdominal:     General: There is no distension.  Genitourinary:    Comments: No cva tenderness. Musculoskeletal:     Comments: Tenderness to left achilles most pronounced 4-10 cm above heel. Ankle is stable. No medial  or lateral ankle swelling or tenderness. Dp/pt 2+. Normal rom toes. Impaired plantar flexion at ankle. Compartments of lower leg soft, not tense, no significant sts noted.   Skin:    General: Skin is warm and dry.     Findings: No rash.  Neurological:     Mental Status: He is alert.     Comments: Alert, speech clear. Left foot motor/sens grossly intact.   Psychiatric:        Mood and Affect: Mood normal.      ED Treatments / Results  Labs (all labs ordered are listed, but only abnormal results are displayed) Labs Reviewed - No data to display  EKG None  Radiology No results found.  Procedures Procedures (including critical care time)  Medications Ordered in ED Medications - No data to display   Initial Impression / Assessment and Plan / ED Course  I have reviewed the triage vital signs and the nursing notes.  Pertinent labs & imaging  results that were available during my care of the patient were reviewed by me and considered in my medical decision making (see chart for details).  Orthopedic surgery consulted.   Reviewed nursing notes and prior charts for additional history.   Discussed pt with Dr Lucia Gaskins, pt with suspected achilles tendon injury/rupture - he indicates no need for imaging in ED, to splint in some plantar flexion, crutches, f/o office later this week.  Ortho tech to splint in plantar flexion, provide crutches.   Acetaminophen po, ibuprofen po.  Recheck post splint, pain is controlled, normal cap refill distally in toes.     Final Clinical Impressions(s) / ED Diagnoses   Final diagnoses:  None    ED Discharge Orders    None       Lajean Saver, MD 07/31/19 1133

## 2019-07-31 NOTE — Progress Notes (Signed)
Orthopedic Tech Progress Note Patient Details:  Eric Mcclain [birth year] [id number] Done by Sander Nephew. Ortho Devices Type of Ortho Device: Ace wrap, Crutches, Post splint, Post (short) splint, Post (short leg) splint Splint Material: Fiberglass Ortho Device/Splint Location: Lt leg Ortho Device/Splint Interventions: Application, Adjustment   Post Interventions Patient Tolerated: Well Instructions Provided: Adjustment of device, Care of device   [name] [facility] 07/31/2019, 11:36 AM

## 2019-07-31 NOTE — Discharge Instructions (Addendum)
It was our pleasure to provide your ER care today - we hope that you feel better.  Wear splint - keep it clean/dry. Use crutches. Avoid bearing weight on left foot/leg.  Take acetaminophen and/or ibuprofen as need for pain.  Follow up with orthopedist in the coming week - call office today to arrange appointment.  Return to ER if worse, new symptoms, severe or intractable pain, numbness, or other concern.

## 2019-07-31 NOTE — ED Triage Notes (Signed)
Pt complaint of left ankle pain post hearing "pop in achilles tendon" while playing basketball just PTA.

## 2019-07-31 NOTE — ED Notes (Signed)
Ortho techs at bedside.

## 2019-07-31 NOTE — ED Notes (Signed)
Called ortho for splint and crutches

## 2019-08-15 ENCOUNTER — Other Ambulatory Visit: Payer: Self-pay

## 2019-08-25 ENCOUNTER — Ambulatory Visit
Admission: RE | Admit: 2019-08-25 | Discharge: 2019-08-25 | Disposition: A | Discharge status: Home / Self Care | Payer: Self-pay | Source: Ambulatory Visit | Attending: Neurology | Admitting: Neurology

## 2019-08-25 ENCOUNTER — Other Ambulatory Visit: Payer: Self-pay

## 2019-08-25 DIAGNOSIS — M5412 Radiculopathy, cervical region: Secondary | ICD-10-CM

## 2019-08-28 ENCOUNTER — Telehealth: Payer: Self-pay

## 2019-08-28 NOTE — Telephone Encounter (Signed)
Patient advised of Mri results 

## 2019-08-28 NOTE — Telephone Encounter (Signed)
-----   Message from Alda Berthold, DO sent at 08/28/2019  7:59 AM EDT ----- Please inform patient that his MRI cervical spine is normal. No evidence of nerve impingement or injury.  From a neurological standpoint, he can resume work without any restrictions. Thanks.

## 2019-10-05 ENCOUNTER — Ambulatory Visit (INDEPENDENT_AMBULATORY_CARE_PROVIDER_SITE_OTHER): Payer: Self-pay | Admitting: Primary Care

## 2019-10-05 ENCOUNTER — Encounter (INDEPENDENT_AMBULATORY_CARE_PROVIDER_SITE_OTHER): Payer: Self-pay | Admitting: Primary Care

## 2019-10-05 ENCOUNTER — Other Ambulatory Visit: Payer: Self-pay

## 2019-10-05 VITALS — BP 126/73 | HR 77 | Temp 97.3°F | Ht 79.0 in | Wt 234.4 lb

## 2019-10-05 DIAGNOSIS — M545 Low back pain, unspecified: Secondary | ICD-10-CM

## 2019-10-05 DIAGNOSIS — Z0000 Encounter for general adult medical examination without abnormal findings: Secondary | ICD-10-CM

## 2019-10-05 DIAGNOSIS — M542 Cervicalgia: Secondary | ICD-10-CM

## 2019-10-05 NOTE — Patient Instructions (Signed)
Cervical Sprain  A cervical sprain is a stretch or tear in the tissues that connect bones (ligaments) in the neck. Most neck (cervical) sprains get better in 4-6 weeks. Follow these instructions at home: If you have a neck collar:  Wear it as told by your doctor. Do not take off (do not remove) the collar unless your doctor says that this is safe.  Ask your doctor before adjusting your collar.  If you have long hair, keep it outside of the collar.  Ask your doctor if you may take off the collar for cleaning and bathing. If you may take off the collar: ? Follow instructions from your doctor about how to take off the collar safely. ? Clean the collar by wiping it with mild soap and water. Let it air-dry all the way. ? If your collar has removable pads:  Take the pads out every 1-2 days.  Hand wash the pads with soap and water.  Let the pads air-dry all the way before you put them back in the collar. Do not dry them in a clothes dryer. Do not dry them with a hair dryer. ? Check your skin under the collar for irritation or sores. If you see any, tell your doctor. Managing pain, stiffness, and swelling   Use a cervical traction device, if told by your doctor.  If told, put heat on the affected area. Do this before exercises (physical therapy) or as often as told by your doctor. Use the heat source that your doctor recommends, such as a moist heat pack or a heating pad. ? Place a towel between your skin and the heat source. ? Leave the heat on for 20-30 minutes. ? Take the heat off (remove the heat) if your skin turns bright red. This is very important if you cannot feel pain, heat, or cold. You may have a greater risk of getting burned.  Put ice on the affected area. ? Put ice in a plastic bag. ? Place a towel between your skin and the bag. ? Leave the ice on for 20 minutes, 2-3 times a day. Activity  Do not drive while wearing a neck collar. If you do not have a neck collar, ask  your doctor if it is safe to drive.  Do not drive or use heavy machinery while taking prescription pain medicine or muscle relaxants, unless your doctor approves.  Do not lift anything that is heavier than 10 lb (4.5 kg) until your doctor tells you that it is safe.  Rest as told by your doctor.  Avoid activities that make you feel worse. Ask your doctor what activities are safe for you.  Do exercises as told by your doctor or physical therapist. Preventing neck sprain  Practice good posture. Adjust your workstation to help with this, if needed.  Exercise regularly as told by your doctor or physical therapist.  Avoid activities that are risky or may cause a neck sprain (cervical sprain). General instructions  Take over-the-counter and prescription medicines only as told by your doctor.  Do not use any products that contain nicotine or tobacco. This includes cigarettes and e-cigarettes. If you need help quitting, ask your doctor.  Keep all follow-up visits as told by your doctor. This is important. Contact a doctor if:  You have pain or other symptoms that get worse.  You have symptoms that do not get better after 2 weeks.  You have pain that does not get better with medicine.  You start to   have new, unexplained symptoms.  You have sores or irritated skin from wearing your neck collar. Get help right away if:  You have very bad pain.  You have any of the following in any part of your body: ? Loss of feeling (numbness). ? Tingling. ? Weakness.  You cannot move a part of your body (you have paralysis).  Your activity level does not improve. Summary  A cervical sprain is a stretch or tear in the tissues that connect bones (ligaments) in the neck.  If you have a neck (cervical) collar, do not take off the collar unless your doctor says that this is safe.  Put ice on affected areas as told by your doctor.  Put heat on affected areas as told by your doctor.  Good  posture and regular exercise can help prevent a neck sprain from happening again. This information is not intended to replace advice given to you by your health care provider. Make sure you discuss any questions you have with your health care provider. Document Released: 04/27/2008 Document Revised: 03/01/2019 Document Reviewed: 07/21/2016 Elsevier Patient Education  2020 [employer]  
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
## 2020-01-23 IMAGING — MR MR CERVICAL SPINE W/O CM
5 series · 30 of 48 positions shown · non-contrast
Comparison: None.

CLINICAL DATA: Right arm weakness, neck pain

EXAM:
MRI CERVICAL SPINE WITHOUT CONTRAST
TECHNIQUE: Multiplanar, multisequence MR imaging of the cervical spine was
performed. No intravenous contrast was administered.

[Series 2: T2 · sagittal · 3.0mm · 0.41mm/px · 6 of 13 slices shown (1 of 2)]
[im 1/13]
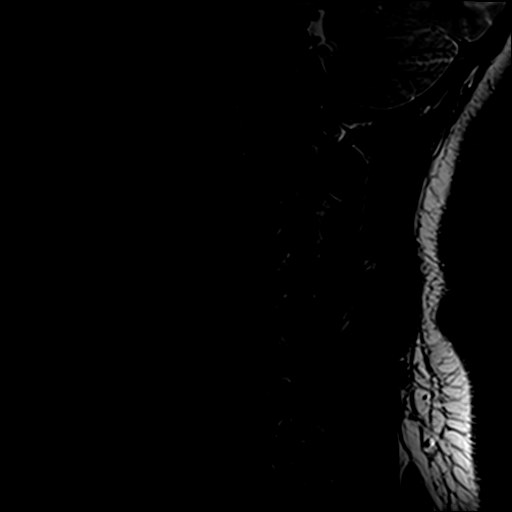
[im 3/13]
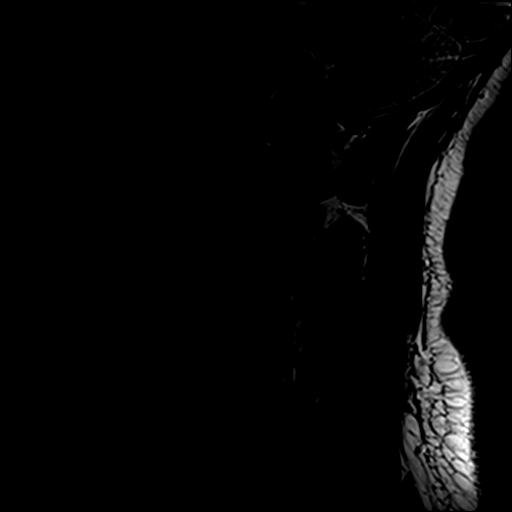
[im 5/13]
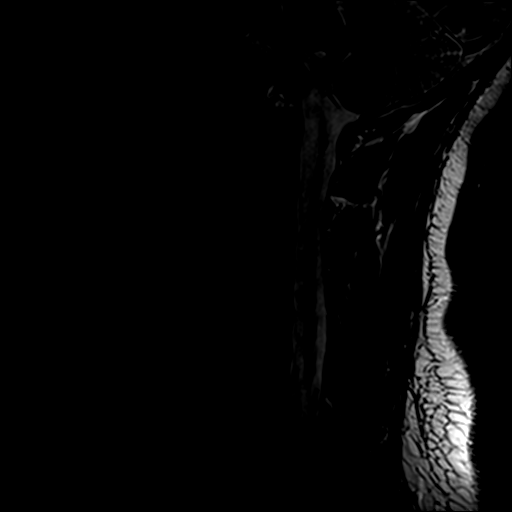
[im 8/13]
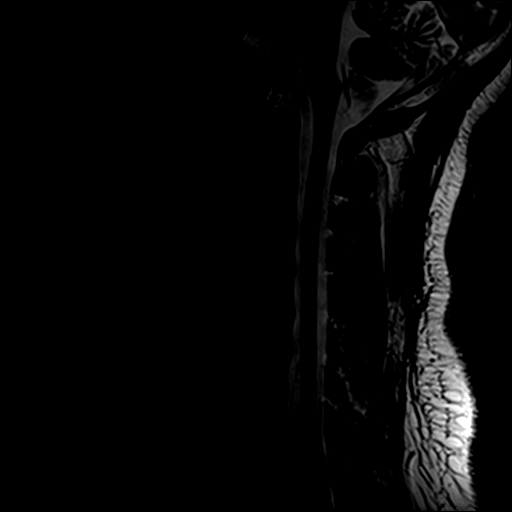
[im 10/13]
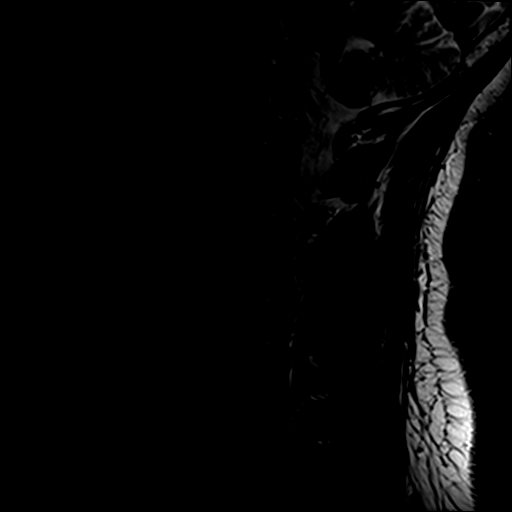
[im 13/13]
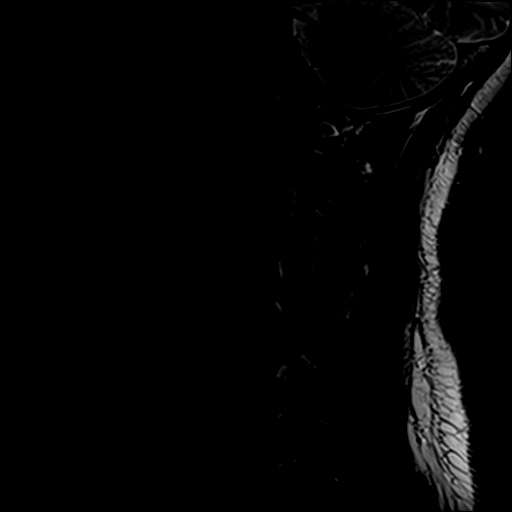

[Series 3: T1 · sagittal · 3.0mm · 0.41mm/px · 7 of 13 slices shown]
[im 1/13]
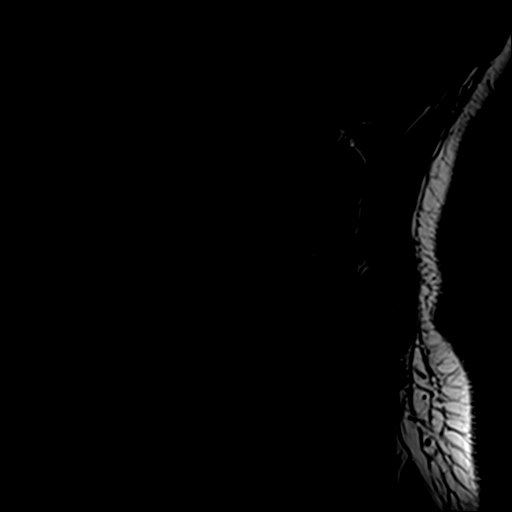
[im 3/13]
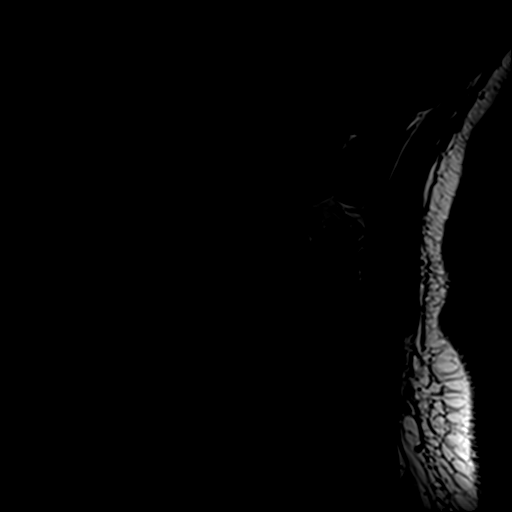
[im 5/13]
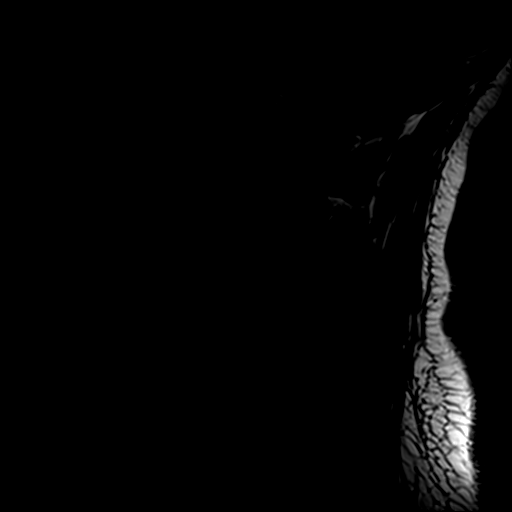
[im 7/13]
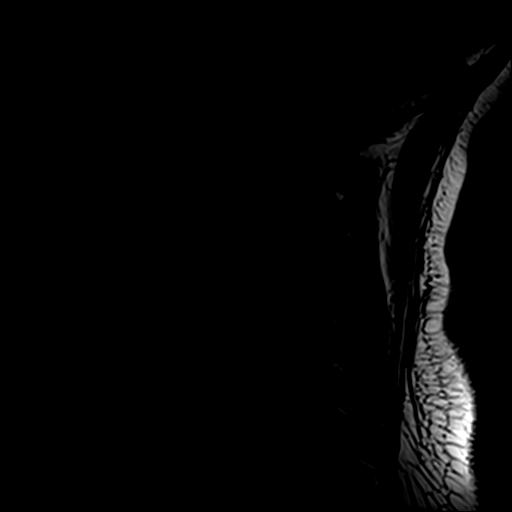
[im 9/13]
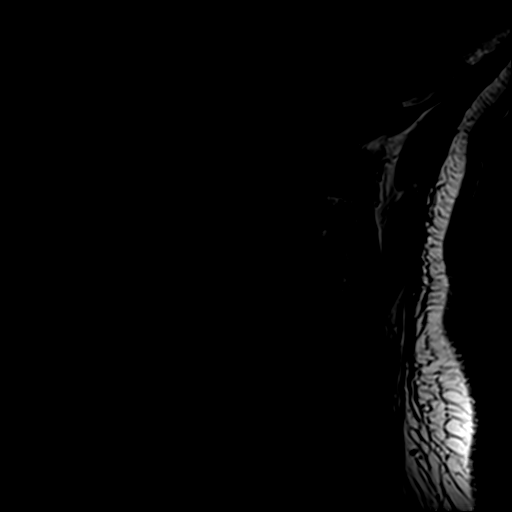
[im 11/13]
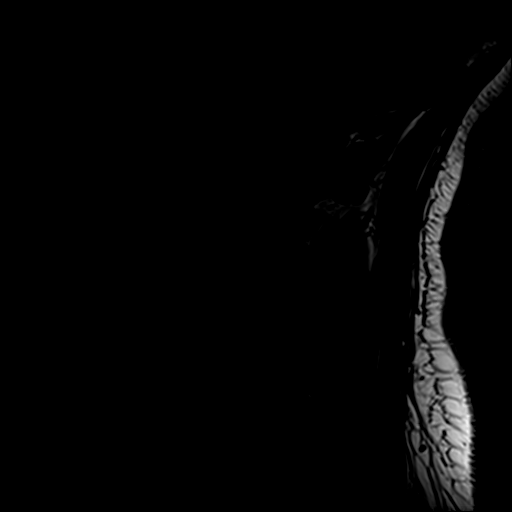
[im 13/13]
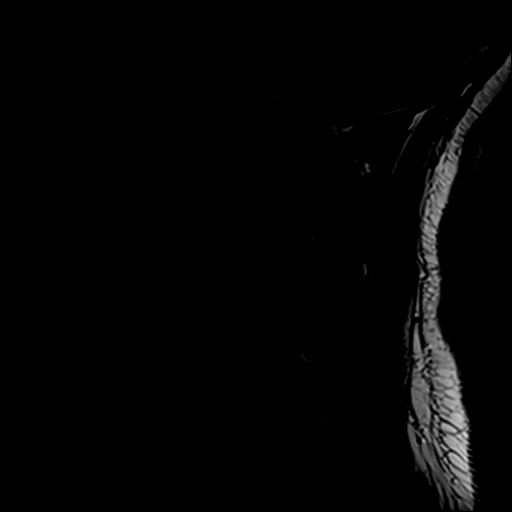

[Series 4: STIR · sagittal · 3.0mm · 0.82mm/px · 7 of 13 slices shown]
[im 1/13]
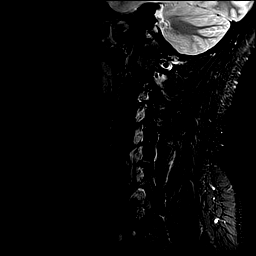
[im 3/13]
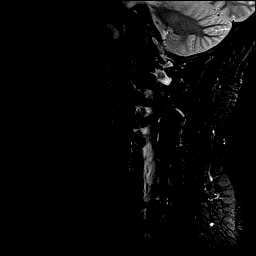
[im 5/13]
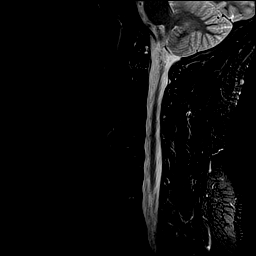
[im 7/13]
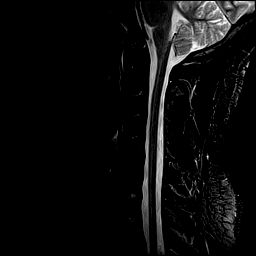
[im 9/13]
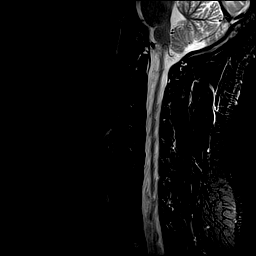
[im 11/13]
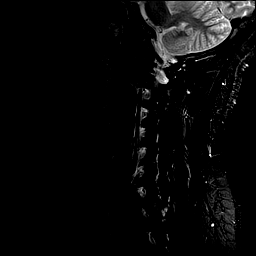
[im 13/13]
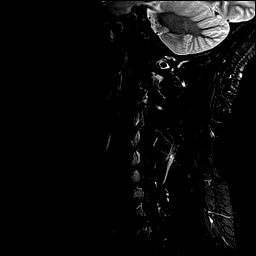

[Series 5: GRE · axial · 3.0mm · 0.35mm/px · z∈[-68,-53]mm · 2 of 28 slices shown]
[im 1/28]
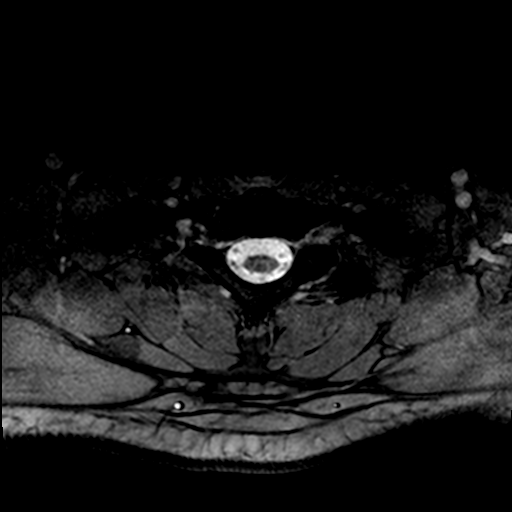
[im 5/28]
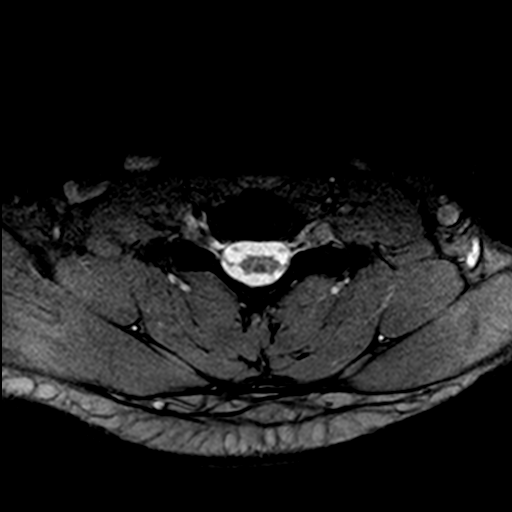

[Series 6: T2 · axial · 3.0mm · 0.94mm/px · z∈[-68,+35]mm · 8 of 27 slices shown (2 of 2)]
[im 1/27]
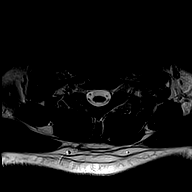
[im 5/27]
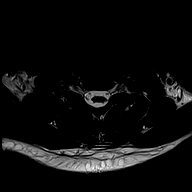
[im 9/27]
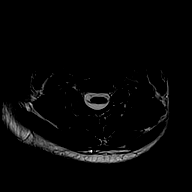
[im 13/27]
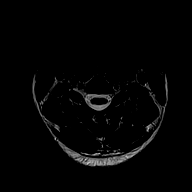
[im 15/27]
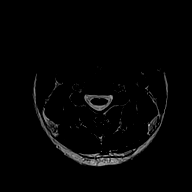
[im 19/27]
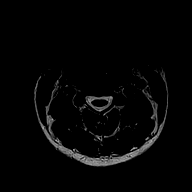
[im 23/27]
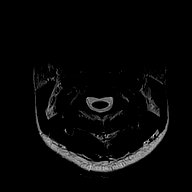
[im 27/27]
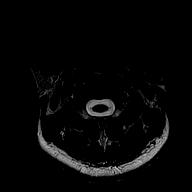

[30 of 48 positions shown; findings below may reference images not displayed]

FINDINGS: Alignment: Physiologic.

Vertebrae: No fracture, evidence of discitis, or bone lesion.

Cord: Normal signal and morphology.

Posterior Fossa, vertebral arteries, paraspinal tissues: Posterior
fossa demonstrates no focal abnormality. Vertebral artery flow voids
are maintained. Paraspinal soft tissues are unremarkable.

Disc levels:

Discs: Disc spaces are maintained.

C2-3: No significant disc bulge. No neural foraminal stenosis. No
central canal stenosis.

C3-4: No significant disc bulge. No neural foraminal stenosis. No
central canal stenosis.

C4-5: No significant disc bulge. No neural foraminal stenosis. No
central canal stenosis.

C5-6: Small central annular fissure. No disc protrusion. No neural
foraminal stenosis. No central canal stenosis.

C6-7: Small central annular fissure. No disc protrusion. No neural
foraminal stenosis. No central canal stenosis.

C7-T1: No significant disc bulge. No neural foraminal stenosis. No
central canal stenosis.
IMPRESSION: 1. No significant cervical spine disc protrusion, foraminal stenosis
or central canal stenosis.
2.  No acute osseous injury of the cervical spine.

## 2020-04-19 ENCOUNTER — Other Ambulatory Visit: Payer: Self-pay

## 2020-10-29 ENCOUNTER — Telehealth (INDEPENDENT_AMBULATORY_CARE_PROVIDER_SITE_OTHER): Payer: Self-pay

## 2020-10-29 NOTE — Telephone Encounter (Signed)
Copied from CRM [id number]. Topic: General - Inquiry >> Oct 29, 2020 10:59 AM Adrian Prince D wrote: Reason for CRM: Daune Perch called needing some clarification on some medical records she received. She can be reached at [phone]. Please advise

## 2024-04-23 ENCOUNTER — Encounter (HOSPITAL_BASED_OUTPATIENT_CLINIC_OR_DEPARTMENT_OTHER): Payer: Self-pay

## 2024-04-23 ENCOUNTER — Emergency Department (HOSPITAL_BASED_OUTPATIENT_CLINIC_OR_DEPARTMENT_OTHER)

## 2024-04-23 ENCOUNTER — Emergency Department (HOSPITAL_BASED_OUTPATIENT_CLINIC_OR_DEPARTMENT_OTHER)
Admission: EM | Admit: 2024-04-23 | Discharge: 2024-04-23 | Disposition: A | Discharge status: Home / Self Care | Attending: Emergency Medicine | Admitting: Emergency Medicine

## 2024-04-23 ENCOUNTER — Other Ambulatory Visit: Payer: Self-pay

## 2024-04-23 DIAGNOSIS — M25532 Pain in left wrist: Secondary | ICD-10-CM | POA: Insufficient documentation

## 2024-04-23 MED ORDER — LIDOCAINE 5 % EX PTCH
1.0000 | MEDICATED_PATCH | CUTANEOUS | Status: DC
  Administered 2024-04-23: 1 via TRANSDERMAL
  Filled 2024-04-23: qty 1

## 2024-04-23 MED ORDER — ACETAMINOPHEN 500 MG PO TABS
1000.0000 mg | ORAL_TABLET | Freq: Once | ORAL | Status: AC
  Administered 2024-04-23: 1000 mg via ORAL
  Filled 2024-04-23: qty 2

## 2024-04-23 MED ORDER — KETOROLAC TROMETHAMINE 15 MG/ML IJ SOLN
15.0000 mg | Freq: Once | INTRAMUSCULAR | Status: AC
  Administered 2024-04-23: 15 mg via INTRAMUSCULAR
  Filled 2024-04-23: qty 1

## 2024-04-23 NOTE — ED Triage Notes (Signed)
 In for eval of left wrist pain and swelling, was playing at [facility] air and his wrist bent back. Reports that he originally injured his wrist 2 months ago while doing pull-ups. Motor and sensation intact.

## 2024-04-23 NOTE — Discharge Instructions (Addendum)
 You were seen in the emerged part today for evaluation of your wrist pain.  The x-ray does show some mild tissue swelling but no bony abnormalities.  He will need to follow-up with a hand specialist.  I have included information for Dr. Glenora Laos into the discharge paperwork.  Please make sure you attend your appointment on Tuesday.  For pain, I recommend 600 mg of ibuprofen  and/or 1000 mg of Tylenol  every 6 hours as needed for pain.  You could also try the wrist brace to see if this helps with any support.  If you start to have any numbness, tingling, swelling, fevers, inability to bend your wrist, color changes, temperature changes, please return to your nearest emergency department for reevaluation.  Otherwise, if you have any other concerns, new or worsening symptoms, please return to the nearest emerged department for reevaluation.  Contact a doctor if: You have a sudden, sharp pain in the wrist, hand, or arm that is different or new. Any swelling or bruising on your wrist or hand gets worse. Your skin: Becomes red. Has a rash. Has open sores. Your pain does not get better. Your pain gets worse. You have a fever or chills. Get help right away if: You lose feeling in your fingers or hand. Your fingers turn white, very red, or cold and blue. You cannot move your fingers.

## 2024-04-23 NOTE — ED Provider Notes (Cosign Needed Addendum)
 [place] EMERGENCY DEPARTMENT AT [facility] Provider Note   CSN: [id number] Arrival date & time: 04/23/24  1819     History Chief Complaint  Patient presents with   Wrist Pain    Eric Mcclain is a 33 y.o. male self reportedly otherwise healthy presents emerged from today for evaluation of left wrist pain.  Patient reports a little over 2 months ago he was doing some pull-ups and started having pain in the dorsum of his left wrist.  He reports he is continue to lift weights and exercise since then but does have pain with flexion and extension of the wrist.  Denies any numbness or tingling into the fingers.  Has not tried any medication for symptoms.  Does have follow-up appointment with [facility] on Tuesday.  He reports that when he was at [facility] air today he was moving his hands and a rowing motion with one of the toys and started to feel pain again.  Denies any falls.  He reports he is always had that small amount of swelling to the dorsum of his wrist since the initial injury 2 months ago.   Wrist Pain       Home Medications Prior to Admission medications   Not on File      Allergies    Patient has no known allergies.    Review of Systems   Review of Systems  Constitutional:  Negative for fever.  Musculoskeletal:  Positive for arthralgias.  Neurological:  Negative for weakness and numbness.    Physical Exam Updated Vital Signs BP 130/79 (BP Location: Right Arm)   Pulse 74   Temp 98.6 F (37 C)   Resp 18   Ht 6\' 7"  (2.007 m)   Wt 108.9 kg   SpO2 100%   BMI 27.04 kg/m  Physical Exam Vitals and nursing note reviewed.  Constitutional:      General: He is not in acute distress.    Appearance: He is not ill-appearing or toxic-appearing.  HENT:     Mouth/Throat:     Mouth: Mucous membranes are moist.  Eyes:     General: No scleral icterus. Pulmonary:     Effort: Pulmonary effort is normal. No respiratory distress.  Musculoskeletal:        Hands:     Comments: Tenderness to the marked area above. Some minor swelling, but no overlying warmth or erythema.  Not firm or indurated.  Patient is able to fully flex and extend however has pain with doing so.  Full strength of the wrist and fingers with and without resistance.  Brisk cap refill present all 5 fingers.  Palpable radial pulse.  No snuffbox tenderness.  No discoloration.  Warmth present in the distal fingers and hand.  No tenderness into the forearm.   Skin:    General: Skin is warm and dry.  Neurological:     General: No focal deficit present.     Mental Status: He is alert.     ED Results / Procedures / Treatments   Labs (all labs ordered are listed, but only abnormal results are displayed) Labs Reviewed - No data to display  EKG None  Radiology DG Wrist Complete Left Result Date: 04/23/2024 CLINICAL DATA:  Left wrist pain and swelling after injury. EXAM: LEFT WRIST - COMPLETE 3+ VIEW COMPARISON:  04/08/2019 FINDINGS: Mild dorsal soft tissue swelling over the left wrist. Bones appear intact. No evidence of acute fracture or dislocation. No focal bone lesion or bone destruction.  Bone cortex appears intact. Joint spaces are normal. IMPRESSION: No evidence of active pulmonary disease. Mild dorsal soft tissue swelling. Electronically Signed   By: Boyce Byes M.D.   On: 04/23/2024 20:10    Procedures Procedures   Medications Ordered in ED Medications - No data to display  ED Course/ Medical Decision Making/ A&P   Medical Decision Making Amount and/or Complexity of Data Reviewed Radiology: ordered.  Risk OTC drugs. Prescription drug management.   33 y.o. male presents to the ER for evaluation of left wrist pain. Differential diagnosis includes but is not limited to trauma, sprain, strain, dislocation, tendinitis, soft tissue injury. Vital signs unremarkable. Physical exam as noted above.   Wrist XR shows mild dorsal soft tissue swelling.  Patient does have  bony abnormality present to the distal portion of the index metacarpal however this is present on imaging from years prior and appears to be a bony ossicle possibly from prior injury.  Per my independent interpretation.  The patient has an already established appointment with a hand specialist on Tuesday.  There is no overlying warmth, I doubt any septic arthritis.  Send present for 2 months and he has no fevers.  Likely could be some soft tissue injury or possible ganglion cyst.  Patient declines narcotic pain medication.  Will give him Tylenol  and Toradol here.  Lidocaine  patch with a wrist brace.  Instructed him about keeping the area elevated to help with pain.  Instructed for him to go to his already scheduled appointment with Dr. Glenora Laos.  He is neurovascularly intact distally.  He is hemodynamically stable and stable for discharge home with close outpatient follow-up.  We discussed the results of the labs/imaging. The plan is supportive care, brace, follow-up with hand specialist. We discussed strict return precautions and red flag symptoms. The patient verbalized their understanding and agrees to the plan. The patient is stable and being discharged home in good condition.  Portions of this report may have been transcribed using voice recognition software. Every effort was made to ensure accuracy; however, inadvertent computerized transcription errors may be present.    Final Clinical Impression(s) / ED Diagnoses Final diagnoses:  Left wrist pain    Rx / DC Orders ED Discharge Orders     None         Spence Dux, PA-C 04/23/24 2225    Spence Dux, PA-C 04/23/24 2225    Sallyanne Creamer, DO 04/25/24 2027
